# Patient Record
Sex: Male | Born: 1955 | Race: Black or African American | Hispanic: No | Marital: Single | Smoking: Never smoker
Health system: Southern US, Community
[De-identification: ages and names within clinical notes are randomized; demographics above are authoritative.]

## PROBLEM LIST (undated history)

## (undated) DIAGNOSIS — E785 Hyperlipidemia, unspecified: Secondary | ICD-10-CM

## (undated) DIAGNOSIS — G808 Other cerebral palsy: Secondary | ICD-10-CM

## (undated) DIAGNOSIS — H409 Unspecified glaucoma: Secondary | ICD-10-CM

## (undated) DIAGNOSIS — G40909 Epilepsy, unspecified, not intractable, without status epilepticus: Secondary | ICD-10-CM

## (undated) DIAGNOSIS — F79 Unspecified intellectual disabilities: Secondary | ICD-10-CM

## (undated) DIAGNOSIS — Z930 Tracheostomy status: Secondary | ICD-10-CM

---

## 2019-01-15 ENCOUNTER — Encounter (HOSPITAL_COMMUNITY): Payer: Self-pay | Admitting: Emergency Medicine

## 2019-01-15 ENCOUNTER — Emergency Department (HOSPITAL_COMMUNITY)
Admission: EM | Admit: 2019-01-15 | Discharge: 2019-01-16 | Disposition: A | Discharge status: Home / Self Care | Payer: 59 | Attending: Emergency Medicine | Admitting: Emergency Medicine

## 2019-01-15 ENCOUNTER — Emergency Department (HOSPITAL_COMMUNITY): Payer: 59

## 2019-01-15 ENCOUNTER — Other Ambulatory Visit: Payer: Self-pay

## 2019-01-15 DIAGNOSIS — Z794 Long term (current) use of insulin: Secondary | ICD-10-CM | POA: Insufficient documentation

## 2019-01-15 DIAGNOSIS — Z7901 Long term (current) use of anticoagulants: Secondary | ICD-10-CM | POA: Diagnosis not present

## 2019-01-15 DIAGNOSIS — Z20828 Contact with and (suspected) exposure to other viral communicable diseases: Secondary | ICD-10-CM | POA: Insufficient documentation

## 2019-01-15 DIAGNOSIS — R042 Hemoptysis: Secondary | ICD-10-CM | POA: Diagnosis not present

## 2019-01-15 DIAGNOSIS — Z79899 Other long term (current) drug therapy: Secondary | ICD-10-CM | POA: Insufficient documentation

## 2019-01-15 NOTE — ED Triage Notes (Addendum)
[facility]- pt is a transfer from [facility]. Pt is dependent on a trach vent. Pt was recently started on Eliquis due to afib. Today the nurses at [facility] were suctioning the patient and ended up noticing a lot of blood coming from his trach vent. Estimated about 200 mL. There physician sent the patient here for evaluation. Pt is currently stable. Pt has a foley placed and a PICC line in the right upper arm. 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 Pt is at baseline and only opens his eyes. Does not respond to commands

## 2019-01-15 NOTE — ED Notes (Signed)
ED Provider at bedside. 

## 2019-01-16 DIAGNOSIS — R042 Hemoptysis: Secondary | ICD-10-CM | POA: Diagnosis not present

## 2019-01-16 DIAGNOSIS — J9621 Acute and chronic respiratory failure with hypoxia: Secondary | ICD-10-CM

## 2019-01-16 LAB — CBC
HCT: 33.4 % — ABNORMAL LOW (ref 39.0–52.0)
Hemoglobin: 10.8 g/dL — ABNORMAL LOW (ref 13.0–17.0)
MCH: 30.9 pg (ref 26.0–34.0)
MCHC: 32.3 g/dL (ref 30.0–36.0)
MCV: 95.7 fL (ref 80.0–100.0)
Platelets: 403 10*3/uL — ABNORMAL HIGH (ref 150–400)
RBC: 3.49 MIL/uL — ABNORMAL LOW (ref 4.22–5.81)
RDW: 17.5 % — ABNORMAL HIGH (ref 11.5–15.5)
WBC: 7.6 10*3/uL (ref 4.0–10.5)
nRBC: 0 % (ref 0.0–0.2)

## 2019-01-16 LAB — CBC WITH DIFFERENTIAL/PLATELET
Abs Immature Granulocytes: 0.03 10*3/uL (ref 0.00–0.07)
Basophils Absolute: 0 10*3/uL (ref 0.0–0.1)
Basophils Relative: 1 %
Eosinophils Absolute: 0.2 10*3/uL (ref 0.0–0.5)
Eosinophils Relative: 3 %
HCT: 32.8 % — ABNORMAL LOW (ref 39.0–52.0)
Hemoglobin: 10.3 g/dL — ABNORMAL LOW (ref 13.0–17.0)
Immature Granulocytes: 0 %
Lymphocytes Relative: 35 %
Lymphs Abs: 2.6 10*3/uL (ref 0.7–4.0)
MCH: 30.2 pg (ref 26.0–34.0)
MCHC: 31.4 g/dL (ref 30.0–36.0)
MCV: 96.2 fL (ref 80.0–100.0)
Monocytes Absolute: 0.9 10*3/uL (ref 0.1–1.0)
Monocytes Relative: 11 %
Neutro Abs: 3.8 10*3/uL (ref 1.7–7.7)
Neutrophils Relative %: 50 %
Platelets: 449 10*3/uL — ABNORMAL HIGH (ref 150–400)
RBC: 3.41 MIL/uL — ABNORMAL LOW (ref 4.22–5.81)
RDW: 17.6 % — ABNORMAL HIGH (ref 11.5–15.5)
WBC: 7.5 10*3/uL (ref 4.0–10.5)
nRBC: 0 % (ref 0.0–0.2)

## 2019-01-16 LAB — COMPREHENSIVE METABOLIC PANEL
ALT: 19 U/L (ref 0–44)
AST: 33 U/L (ref 15–41)
Albumin: 2.6 g/dL — ABNORMAL LOW (ref 3.5–5.0)
Alkaline Phosphatase: 164 U/L — ABNORMAL HIGH (ref 38–126)
Anion gap: 11 (ref 5–15)
BUN: 18 mg/dL (ref 8–23)
CO2: 28 mmol/L (ref 22–32)
Calcium: 9.6 mg/dL (ref 8.9–10.3)
Chloride: 95 mmol/L — ABNORMAL LOW (ref 98–111)
Creatinine, Ser: 0.47 mg/dL — ABNORMAL LOW (ref 0.61–1.24)
GFR calc Af Amer: >60 mL/min (ref >60)
GFR calc non Af Amer: >60 mL/min (ref >60)
Glucose, Bld: 104 mg/dL — ABNORMAL HIGH (ref 70–99)
Potassium: 4.1 mmol/L (ref 3.5–5.1)
Sodium: 134 mmol/L — ABNORMAL LOW (ref 135–145)
Total Bilirubin: 0.8 mg/dL (ref 0.3–1.2)
Total Protein: 8.3 g/dL — ABNORMAL HIGH (ref 6.5–8.1)

## 2019-01-16 LAB — PROTIME-INR
INR: 1.5 — ABNORMAL HIGH (ref 0.8–1.2)
Prothrombin Time: 17.7 seconds — ABNORMAL HIGH (ref 11.4–15.2)

## 2019-01-16 LAB — APTT: aPTT: 34 seconds (ref 24–36)

## 2019-01-16 LAB — SARS CORONAVIRUS 2 BY RT PCR (HOSPITAL ORDER, PERFORMED IN ~~LOC~~ HOSPITAL LAB): SARS Coronavirus 2: NEGATIVE

## 2019-01-16 NOTE — ED Notes (Signed)
[facility] to transport patient after shift change. Per [facility] there are no trucks available.

## 2019-01-16 NOTE — ED Notes (Signed)
[facility] transporting pt. At this time

## 2019-01-16 NOTE — ED Notes (Signed)
Confirmed transport of pt to facility. Discharge instructions discussed with Day shift Buyer, retail

## 2019-01-16 NOTE — ED Notes (Signed)
[facility] called for transport at this time. 

## 2019-01-16 NOTE — ED Notes (Signed)
[facility] called for transport to [facility]. [facility] stated would be after shift change, no trucks available

## 2019-01-16 NOTE — ED Notes (Signed)
Discharge Instructions discussed with, Chartered loss adjuster, at [facility]. Pt to go back to facility.

## 2019-01-16 NOTE — ED Provider Notes (Signed)
[facility] EMERGENCY DEPARTMENT Provider Note   CSN: [id number] Arrival date & time: 01/15/19  2243     History   Chief Complaint No chief complaint on file.   HPI Roy Crawford is a 63 y.o. male.     HPI  Level 5 caveat for cognitive delay.  63 year old comes in a chief complaint of hemoptysis. Patient has history of A. fib on Eliquis, cognitive delay.  Patient had respiratory failure after a bout of aspiration pneumonia earlier this year, now is vent dependent and residing at an LTAC.  He was sent from the [facility] facility with chief complaint of large-volume hemoptysis.  According to the nursing report, patient had about 200 cc of bloody aspirate from his trach prior to ED arrival.  Spoke with the facility and they deny any fevers, chills.   History reviewed. No pertinent past medical history.  There are no active problems to display for this patient.   History reviewed. No pertinent surgical history.      Home Medications    Prior to Admission medications   Medication Sig Start Date End Date Taking? Authorizing Provider  albuterol (PROVENTIL) (2.5 MG/3ML) 0.083% nebulizer solution Take 2.5 mg by nebulization every 6 (six) hours.   Yes [provider]  apixaban (ELIQUIS) 5 MG TABS tablet Place 5 mg into feeding tube 2 (two) times daily.   Yes [provider]  chlorhexidine (PERIDEX) 0.12 % solution Use as directed 15 mLs in the mouth or throat 2 (two) times daily.   Yes [provider]  diltiazem (CARDIZEM) 30 MG tablet Place 30 mg into feeding tube 3 (three) times daily.   Yes [provider]  esomeprazole (NEXIUM) 40 MG capsule 40 mg daily at 12 noon. Per Tube   Yes [provider]  furosemide (LASIX) 20 MG tablet Place 20 mg into feeding tube daily.   Yes [provider]  ibuprofen (ADVIL) 600 MG tablet Place 600 mg into feeding tube every 6 (six) hours as needed for fever or mild pain.   Yes  [provider]  insulin lispro (HUMALOG) 100 UNIT/ML injection Inject 2-10 Units into the skin 3 (three) times daily before meals. Blood sugar > 400 Notify MD 151-200= 2 units 201-250= 4 units 251-300= 6 units 301-350= 8 units 351-400= 10 units   Yes [provider]  lactulose (CEPHULAC) 10 g packet 10 g daily as needed (for constipation). Per tube   Yes [provider]  levETIRAcetam (KEPPRA) 1000 MG tablet Place 1,000 mg into feeding tube 2 (two) times daily.   Yes [provider]  lidocaine 20 MG/ML injection 60 mg as needed (for cough). nebulization   Yes [provider]  LORazepam (ATIVAN) 2 MG/ML injection Inject 2 mg into the vein every 2 (two) hours as needed for anxiety.   Yes [provider]  magnesium oxide (MAG-OX) 400 MG tablet Place 400 mg into feeding tube daily.   Yes [provider]  metoCLOPramide (REGLAN) 5 MG tablet Place 5 mg into feeding tube 4 (four) times daily.   Yes [provider]  metoprolol tartrate (LOPRESSOR) 5 MG/5ML SOLN injection Inject 5 mg into the vein every 6 (six) hours as needed (for heart rate over 120).   Yes [provider]  metoprolol tartrate (LOPRESSOR) 50 MG tablet Place 50 mg into feeding tube 2 (two) times daily.   Yes [provider]  Nutritional Supplements (REPLETE) LIQD Place 55 mL/hr into feeding tube  continuous. 0.06 gram-1 kcal/ml   Yes [provider]  polyethylene glycol (MIRALAX / GLYCOLAX) 17 g packet Place 17 g into feeding tube daily as needed for mild constipation.   Yes [provider]  valproic acid (DEPAKENE) 250 MG/5ML SOLN solution Place 1,000 mg into feeding tube 3 (three) times daily.   Yes [provider]    Family History No family history on file.  Social History Social History   Tobacco Use  . Smoking status: Not on file  Substance Use Topics  . Alcohol use: Not on file  . Drug use: Not on file      Allergies   Patient has no allergy information on record.   Review of Systems Review of Systems  Unable to perform ROS: Mental status change     Physical Exam Updated Vital Signs BP (!) 154/76   Pulse 78   Temp 99.9 F (37.7 C) (Oral)   Resp (!) 31   SpO2 99%   Physical Exam Vitals signs and nursing note reviewed.  Constitutional:      General: He is not in acute distress.    Appearance: He is well-developed. He is not diaphoretic.  HENT:     Head: Atraumatic.  Neck:     Musculoskeletal: Neck supple.  Cardiovascular:     Rate and Rhythm: Normal rate.  Pulmonary:     Breath sounds: No wheezing or rhonchi.     Comments: On a ventilator, respiratory rate about 22. Musculoskeletal:     Comments: Contracted, no focal edema  Skin:    General: Skin is warm.  Neurological:     Mental Status: He is oriented to person, place, and time.      ED Treatments / Results  Labs (all labs ordered are listed, but only abnormal results are displayed) Labs Reviewed  COMPREHENSIVE METABOLIC PANEL - Abnormal; Notable for the following components:      Result Value   Sodium 134 (*)    Chloride 95 (*)    Glucose, Bld 104 (*)    Creatinine, Ser 0.47 (*)    Total Protein 8.3 (*)    Albumin 2.6 (*)    Alkaline Phosphatase 164 (*)    All other components within normal limits  CBC WITH DIFFERENTIAL/PLATELET - Abnormal; Notable for the following components:   RBC 3.41 (*)    Hemoglobin 10.3 (*)    HCT 32.8 (*)    RDW 17.6 (*)    Platelets 449 (*)    All other components within normal limits  PROTIME-INR - Abnormal; Notable for the following components:   Prothrombin Time 17.7 (*)    INR 1.5 (*)    All other components within normal limits  CBC - Abnormal; Notable for the following components:   RBC 3.49 (*)    Hemoglobin 10.8 (*)    HCT 33.4 (*)    RDW 17.5 (*)    Platelets 403 (*)    All other components within normal limits  SARS CORONAVIRUS 2 (HOSPITAL ORDER,  [facility])  APTT    EKG EKG Interpretation  Date/Time:  Saturday January 16 2019 01:05:25 EDT Ventricular Rate:  66 PR Interval:    QRS Duration: 84 QT Interval:  418 QTC Calculation: 438 R Axis:   79 Text Interpretation:  Sinus rhythm Short PR interval Repol abnrm suggests ischemia, diffuse leads No acute changes No old tracing to compare Confirmed by Varney Biles [phone]) on 01/16/2019 1:53:08 AM  Radiology Dg Chest Port 1 View  Result Date: 01/16/2019 CLINICAL DATA:  Hemoptysis EXAM: PORTABLE CHEST 1 VIEW COMPARISON:  None. FINDINGS: Tracheostomy is in place with the tip projecting over the mid trachea. Severe thoracolumbar scoliosis. There is cardiomegaly with vascular congestion. No confluent opacity, overt edema or effusions. No acute bony abnormality. IMPRESSION: Cardiomegaly, vascular congestion. Electronically Signed   By: Rolm Baptise M.D.   On: 01/16/2019 00:15    Procedures Procedures (including critical care time)  Medications Ordered in ED Medications - No data to display   Initial Impression / Assessment and Plan / ED Course  I have reviewed the triage vital signs and the nursing notes.  Pertinent labs & imaging results that were available during my care of the patient were reviewed by me and considered in my medical decision making (see chart for details).        63 year old male comes in a chief complaint of large-volume hemoptysis.  He is a vent dependent male who is got history of A. fib and is on DOAC. In the ED, respiratory therapy was called to suction the patient.  We had blood-tinged aspirate about 5 to 10 cc.  Subsequently, there is blood-tinged sputum that is pink in the tubing.  Chest x-ray is negative. Lung exam is not showing any focal abnormalities. White count is normal and there is no fever here.  Differential diagnosis includes pneumonia, PE. We will consult critical care given he is on blood thinners to see  if patient needs admission.  Reassessment:  Pulmonary team is assessed the patient.  He looks stable to them at this time.  They recommend prolonged monitoring in the ED and repeat CBC.  Lab work-up continues to be normal on repeat CBC.  No further episodes of large-volume hemoptysis in the ER.  Chest x-ray reviewed 1 more time and patient reassessed, there is no sign of decline or change in exam.  Stable for discharge.  We will request that the Eliquis be held for 2 or 3 days.   Final Clinical Impressions(s) / ED Diagnoses   Final diagnoses:  Hemoptysis    ED Discharge Orders    None       Varney Biles, MD 01/16/19 [phone]

## 2019-01-16 NOTE — ED Notes (Signed)
All Roy Crawford documentation, discharge instructions, and DNR given to Roy Crawford

## 2019-01-16 NOTE — Discharge Instructions (Addendum)
Hemoglobin stable here and no further episodes of hemoptysis. Blood tinged discharge was noted in suction here.  Xrays are stable.  We recommend holding any blood thinner for 2 days.

## 2019-01-16 NOTE — ED Notes (Signed)
Pulmonologist at bedside

## 2019-01-16 NOTE — H&P (Signed)
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  History of present illness   Nurses at the [facility] noticed bloody secretions on suctioning patient today, reporting 234mL, and sent him to the ED for evaluation.  Patient was evidently recently started on apixaban for Afib.  In the ED, patient is hemodynamically stable, a little hypertensive, and on his home vent settings, satting 100%.    Patient was admitted in July at [facility] for pneumonia and volume overload.  That is when patient was intubated and trached, treated for MRSA pneumonia and diuresed.  Discharged to [facility] initially, then to [facility].    Past Medical History  Epilepsy Chronic contractures Scoliosis HTN Chronic anemia Afib OSA Significant Hospital Events     Consults:  Pulm/cc in the ED  Procedures:    Significant Diagnostic Tests:    Micro Data:  COVID negative  Antimicrobials:  None   Objective   Blood pressure (!) 153/79, pulse 60, temperature 99.9 F (37.7 C), temperature source Oral, resp. rate 12, SpO2 100 %.    Vent Mode: PRVC FiO2 (%):  [30 %] 30 % Set Rate:  [12 bmp] 12 bmp Vt Set:  [500 mL] 500 mL PEEP:  [5 cmH20] 5 cmH20 Plateau Pressure:  [20 cmH20] 20 cmH20  No intake or output data in the 24 hours ending 01/16/19 0129 There were no vitals filed for this visit.  Examination: General: contractures, trach-vent, opens eyes to voice HENT: trach intact, no weeping. Minimal blood-tinged secretions in tubing Lungs: clear bilaterally, no crackles, wheezes Cardiovascular: RRR, 2+ pulses Abdomen: soft, nontender Extremities: 1+ edema, Bilateral contractures in upper and lower  extremities Neuro: at baseline.  Opens eyes to voice.  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  Currently with minimal bloody secretions, CBC is stable at baseline, and patient is a bit hypertensive with signs of vascular congestion on CXR  # Hemoptysis:  Currently with only minimal secretions which are blood-tinged.  CXR within normal limits except for some increased vascular congestion.  Etiology likely multifactorial- blood thinners + HTN + pulmonary congestion.  - recommend recheck CBC in the ED, if stable, and no more hemoptysis, will be ok to discharge back to SNF - would recommend improving blood pressure control and increasing diuresis - would hold apixaban for 2-3 days   Please do not hesitate to call with any questions or concerns  Best practice:   Labs   CBC: Recent Labs  Lab 01/16/19 0008  WBC 7.5  NEUTROABS 3.8  HGB 10.3*  HCT 32.8*  MCV 96.2  PLT 449*    Basic Metabolic Panel: Recent Labs  Lab 01/16/19 0008  NA 134*  K 4.1  CL 95*  CO2 28  GLUCOSE 104*  BUN 18  CREATININE 0.47*  CALCIUM 9.6   GFR: CrCl cannot be calculated (Unknown ideal weight.). Recent Labs  Lab 01/16/19 0008  WBC  7.5    Liver Function Tests: Recent Labs  Lab 01/16/19 0008  AST 33  ALT 19  ALKPHOS 164*  BILITOT 0.8  PROT 8.3*  ALBUMIN 2.6*   No results for input(s): LIPASE, AMYLASE in the last 168 hours. No results for input(s): AMMONIA in the last 168 hours.  ABG No results found for: PHART, PCO2ART, PO2ART, HCO3, TCO2, ACIDBASEDEF, O2SAT   Coagulation Profile: Recent Labs  Lab 01/16/19 0008  INR 1.5*    Cardiac Enzymes: No  results for input(s): CKTOTAL, CKMB, CKMBINDEX, TROPONINI in the last 168 hours.  HbA1C: No results found for: HGBA1C  CBG: No results for input(s): GLUCAP in the last 168 hours.  Review of Systems:   Unable to obtain due to nonverbal status  Past Medical History  He,  has no past medical history on file.   Surgical History   History reviewed. No pertinent surgical history.   Social History      Family History   His family history is not on file.   Allergies Not on File   Home Medications  Prior to Admission medications   Not on File     Critical care time: 40 minutes

## 2019-03-05 ENCOUNTER — Encounter (HOSPITAL_COMMUNITY): Payer: Self-pay | Admitting: Internal Medicine

## 2019-03-05 ENCOUNTER — Inpatient Hospital Stay (HOSPITAL_COMMUNITY)
Admission: EM | Admit: 2019-03-05 | Discharge: 2019-03-08 | DRG: 871 | Disposition: A | Discharge status: Skilled Nursing Facility | Payer: Medicare Other | Source: Skilled Nursing Facility | Attending: Internal Medicine | Admitting: Internal Medicine

## 2019-03-05 ENCOUNTER — Other Ambulatory Visit: Payer: Self-pay

## 2019-03-05 ENCOUNTER — Emergency Department (HOSPITAL_COMMUNITY): Payer: Medicare Other

## 2019-03-05 DIAGNOSIS — R7881 Bacteremia: Secondary | ICD-10-CM | POA: Diagnosis not present

## 2019-03-05 DIAGNOSIS — Z931 Gastrostomy status: Secondary | ICD-10-CM | POA: Diagnosis not present

## 2019-03-05 DIAGNOSIS — F79 Unspecified intellectual disabilities: Secondary | ICD-10-CM

## 2019-03-05 DIAGNOSIS — A4152 Sepsis due to Pseudomonas: Principal | ICD-10-CM | POA: Diagnosis present

## 2019-03-05 DIAGNOSIS — B965 Pseudomonas (aeruginosa) (mallei) (pseudomallei) as the cause of diseases classified elsewhere: Secondary | ICD-10-CM | POA: Diagnosis not present

## 2019-03-05 DIAGNOSIS — Z79899 Other long term (current) drug therapy: Secondary | ICD-10-CM | POA: Diagnosis not present

## 2019-03-05 DIAGNOSIS — J189 Pneumonia, unspecified organism: Secondary | ICD-10-CM | POA: Diagnosis present

## 2019-03-05 DIAGNOSIS — E119 Type 2 diabetes mellitus without complications: Secondary | ICD-10-CM | POA: Diagnosis present

## 2019-03-05 DIAGNOSIS — E785 Hyperlipidemia, unspecified: Secondary | ICD-10-CM | POA: Diagnosis present

## 2019-03-05 DIAGNOSIS — G40909 Epilepsy, unspecified, not intractable, without status epilepticus: Secondary | ICD-10-CM | POA: Diagnosis present

## 2019-03-05 DIAGNOSIS — Z930 Tracheostomy status: Secondary | ICD-10-CM | POA: Diagnosis not present

## 2019-03-05 DIAGNOSIS — E876 Hypokalemia: Secondary | ICD-10-CM | POA: Diagnosis present

## 2019-03-05 DIAGNOSIS — Y95 Nosocomial condition: Secondary | ICD-10-CM | POA: Diagnosis present

## 2019-03-05 DIAGNOSIS — Z7901 Long term (current) use of anticoagulants: Secondary | ICD-10-CM | POA: Diagnosis not present

## 2019-03-05 DIAGNOSIS — R6521 Severe sepsis with septic shock: Secondary | ICD-10-CM | POA: Diagnosis present

## 2019-03-05 DIAGNOSIS — Z20828 Contact with and (suspected) exposure to other viral communicable diseases: Secondary | ICD-10-CM | POA: Diagnosis present

## 2019-03-05 DIAGNOSIS — E875 Hyperkalemia: Secondary | ICD-10-CM | POA: Diagnosis present

## 2019-03-05 DIAGNOSIS — Z794 Long term (current) use of insulin: Secondary | ICD-10-CM

## 2019-03-05 DIAGNOSIS — I10 Essential (primary) hypertension: Secondary | ICD-10-CM | POA: Diagnosis present

## 2019-03-05 DIAGNOSIS — G808 Other cerebral palsy: Secondary | ICD-10-CM | POA: Diagnosis present

## 2019-03-05 DIAGNOSIS — J9601 Acute respiratory failure with hypoxia: Secondary | ICD-10-CM | POA: Diagnosis present

## 2019-03-05 DIAGNOSIS — Z66 Do not resuscitate: Secondary | ICD-10-CM | POA: Diagnosis present

## 2019-03-05 DIAGNOSIS — H409 Unspecified glaucoma: Secondary | ICD-10-CM | POA: Diagnosis present

## 2019-03-05 DIAGNOSIS — J9621 Acute and chronic respiratory failure with hypoxia: Secondary | ICD-10-CM | POA: Diagnosis present

## 2019-03-05 DIAGNOSIS — A419 Sepsis, unspecified organism: Secondary | ICD-10-CM | POA: Diagnosis present

## 2019-03-05 HISTORY — DX: Hyperlipidemia, unspecified: E78.5

## 2019-03-05 HISTORY — DX: Unspecified glaucoma: H40.9

## 2019-03-05 HISTORY — DX: Unspecified intellectual disabilities: F79

## 2019-03-05 HISTORY — DX: Tracheostomy status: Z93.0

## 2019-03-05 HISTORY — DX: Other cerebral palsy: G80.8

## 2019-03-05 HISTORY — DX: Epilepsy, unspecified, not intractable, without status epilepticus: G40.909

## 2019-03-05 LAB — POCT I-STAT 7, (LYTES, BLD GAS, ICA,H+H)
Acid-Base Excess: 5 mmol/L — ABNORMAL HIGH (ref 0.0–2.0)
Bicarbonate: 29.1 mmol/L — ABNORMAL HIGH (ref 20.0–28.0)
Calcium, Ion: 1.19 mmol/L (ref 1.15–1.40)
HCT: 23 % — ABNORMAL LOW (ref 39.0–52.0)
Hemoglobin: 7.8 g/dL — ABNORMAL LOW (ref 13.0–17.0)
O2 Saturation: 98 %
Patient temperature: 104
Potassium: 3.7 mmol/L (ref 3.5–5.1)
Sodium: 138 mmol/L (ref 135–145)
TCO2: 30 mmol/L (ref 22–32)
pCO2 arterial: 44.6 mmHg (ref 32.0–48.0)
pH, Arterial: 7.434 (ref 7.350–7.450)
pO2, Arterial: 114 mmHg — ABNORMAL HIGH (ref 83.0–108.0)

## 2019-03-05 LAB — SARS CORONAVIRUS 2 (TAT 6-24 HRS): SARS Coronavirus 2: NEGATIVE

## 2019-03-05 LAB — URINALYSIS, ROUTINE W REFLEX MICROSCOPIC
Bilirubin Urine: NEGATIVE
Glucose, UA: NEGATIVE mg/dL
Ketones, ur: NEGATIVE mg/dL
Nitrite: NEGATIVE
Protein, ur: 30 mg/dL — AB
Specific Gravity, Urine: 1.013 (ref 1.005–1.030)
WBC, UA: >50 WBC/hpf — ABNORMAL HIGH (ref 0–5)
pH: 5 (ref 5.0–8.0)

## 2019-03-05 LAB — PROTIME-INR
INR: 1.4 — ABNORMAL HIGH (ref 0.8–1.2)
Prothrombin Time: 16.9 seconds — ABNORMAL HIGH (ref 11.4–15.2)

## 2019-03-05 LAB — RESPIRATORY PANEL BY PCR

## 2019-03-05 LAB — CBC WITH DIFFERENTIAL/PLATELET
Abs Immature Granulocytes: 0.87 10*3/uL — ABNORMAL HIGH (ref 0.00–0.07)
Basophils Absolute: 0.1 10*3/uL (ref 0.0–0.1)
Basophils Relative: 0 %
Eosinophils Absolute: 0 10*3/uL (ref 0.0–0.5)
Eosinophils Relative: 0 %
HCT: 35.2 % — ABNORMAL LOW (ref 39.0–52.0)
Hemoglobin: 11.2 g/dL — ABNORMAL LOW (ref 13.0–17.0)
Immature Granulocytes: 4 %
Lymphocytes Relative: 10 %
Lymphs Abs: 2.1 10*3/uL (ref 0.7–4.0)
MCH: 28.5 pg (ref 26.0–34.0)
MCHC: 31.8 g/dL (ref 30.0–36.0)
MCV: 89.6 fL (ref 80.0–100.0)
Monocytes Absolute: 1.4 10*3/uL — ABNORMAL HIGH (ref 0.1–1.0)
Monocytes Relative: 7 %
Neutro Abs: 16.5 10*3/uL — ABNORMAL HIGH (ref 1.7–7.7)
Neutrophils Relative %: 79 %
Platelets: 330 10*3/uL (ref 150–400)
RBC: 3.93 MIL/uL — ABNORMAL LOW (ref 4.22–5.81)
RDW: 17 % — ABNORMAL HIGH (ref 11.5–15.5)
WBC: 20.9 10*3/uL — ABNORMAL HIGH (ref 4.0–10.5)
nRBC: 0 % (ref 0.0–0.2)

## 2019-03-05 LAB — VANCOMYCIN, RANDOM: Vancomycin Rm: 15

## 2019-03-05 LAB — COMPREHENSIVE METABOLIC PANEL
ALT: 22 U/L (ref 0–44)
AST: 43 U/L — ABNORMAL HIGH (ref 15–41)
Albumin: 2.2 g/dL — ABNORMAL LOW (ref 3.5–5.0)
Alkaline Phosphatase: 125 U/L (ref 38–126)
Anion gap: 12 (ref 5–15)
BUN: 45 mg/dL — ABNORMAL HIGH (ref 8–23)
CO2: 28 mmol/L (ref 22–32)
Calcium: 9.2 mg/dL (ref 8.9–10.3)
Chloride: 93 mmol/L — ABNORMAL LOW (ref 98–111)
Creatinine, Ser: 0.98 mg/dL (ref 0.61–1.24)
GFR calc Af Amer: >60 mL/min (ref >60)
GFR calc non Af Amer: >60 mL/min (ref >60)
Glucose, Bld: 90 mg/dL (ref 70–99)
Potassium: 4.3 mmol/L (ref 3.5–5.1)
Sodium: 133 mmol/L — ABNORMAL LOW (ref 135–145)
Total Bilirubin: 1 mg/dL (ref 0.3–1.2)
Total Protein: 6.7 g/dL (ref 6.5–8.1)

## 2019-03-05 LAB — FERRITIN: Ferritin: 318 ng/mL (ref 24–336)

## 2019-03-05 LAB — APTT: aPTT: 37 seconds — ABNORMAL HIGH (ref 24–36)

## 2019-03-05 LAB — LACTIC ACID, PLASMA
Lactic Acid, Venous: 3.3 mmol/L (ref 0.5–1.9)
Lactic Acid, Venous: 2.2 mmol/L (ref 0.5–1.9)
Lactic Acid, Venous: 1.1 mmol/L (ref 0.5–1.9)
Lactic Acid, Venous: 0.9 mmol/L (ref 0.5–1.9)

## 2019-03-05 LAB — C-REACTIVE PROTEIN: CRP: 28.6 mg/dL — ABNORMAL HIGH (ref <1.0)

## 2019-03-05 LAB — FIBRINOGEN: Fibrinogen: 729 mg/dL — ABNORMAL HIGH (ref 210–475)

## 2019-03-05 LAB — HEMOGLOBIN A1C
Hgb A1c MFr Bld: 5.3 % (ref 4.8–5.6)
Mean Plasma Glucose: 105.41 mg/dL

## 2019-03-05 LAB — BRAIN NATRIURETIC PEPTIDE: B Natriuretic Peptide: 655.1 pg/mL — ABNORMAL HIGH (ref 0.0–100.0)

## 2019-03-05 LAB — CBG MONITORING, ED: Glucose-Capillary: 93 mg/dL (ref 70–99)

## 2019-03-05 LAB — PROCALCITONIN: Procalcitonin: 18.59 ng/mL

## 2019-03-05 LAB — D-DIMER, QUANTITATIVE: D-Dimer, Quant: 4.84 ug/mL-FEU — ABNORMAL HIGH (ref 0.00–0.50)

## 2019-03-05 LAB — LACTATE DEHYDROGENASE: LDH: 158 U/L (ref 98–192)

## 2019-03-05 LAB — GLUCOSE, CAPILLARY: Glucose-Capillary: 115 mg/dL — ABNORMAL HIGH (ref 70–99)

## 2019-03-05 MED ORDER — SODIUM CHLORIDE 0.9 % IV BOLUS (SEPSIS)
500.0000 mL | Freq: Once | INTRAVENOUS | Status: AC
  Administered 2019-03-05: 500 mL via INTRAVENOUS

## 2019-03-05 MED ORDER — LACTULOSE 10 G PO PACK: 10.0000 g | PACK | Freq: Every day | ORAL | Status: DC | PRN

## 2019-03-05 MED ORDER — ACETAMINOPHEN 650 MG RE SUPP: 650.0000 mg | Freq: Four times a day (QID) | RECTAL | Status: DC | PRN

## 2019-03-05 MED ORDER — POLYETHYLENE GLYCOL 3350 17 G PO PACK: 17.0000 g | PACK | Freq: Every day | ORAL | Status: DC | PRN

## 2019-03-05 MED ORDER — VITAL HIGH PROTEIN PO LIQD
55.0000 mL/h | ORAL | Status: DC
  Administered 2019-03-05 – 2019-03-08 (×2): 55 mL/h
  Filled 2019-03-05 (×4): qty 1000

## 2019-03-05 MED ORDER — ACETAMINOPHEN 325 MG PO TABS
650.0000 mg | ORAL_TABLET | Freq: Four times a day (QID) | ORAL | Status: DC | PRN
  Administered 2019-03-07: 650 mg via ORAL
  Filled 2019-03-05: qty 2

## 2019-03-05 MED ORDER — SODIUM CHLORIDE 0.9 % IV SOLN
2.0000 g | Freq: Three times a day (TID) | INTRAVENOUS | Status: DC
  Administered 2019-03-05 – 2019-03-08 (×8): 2 g via INTRAVENOUS
  Filled 2019-03-05 (×14): qty 2

## 2019-03-05 MED ORDER — CHLORHEXIDINE GLUCONATE 0.12 % MT SOLN
15.0000 mL | Freq: Two times a day (BID) | OROMUCOSAL | Status: DC
  Administered 2019-03-05 – 2019-03-08 (×7): 15 mL via OROMUCOSAL
  Filled 2019-03-05 (×8): qty 15

## 2019-03-05 MED ORDER — ONDANSETRON HCL 4 MG PO TABS: 4.0000 mg | ORAL_TABLET | Freq: Four times a day (QID) | ORAL | Status: DC | PRN

## 2019-03-05 MED ORDER — SODIUM CHLORIDE 0.9 % IV SOLN
INTRAVENOUS | Status: DC
  Administered 2019-03-05 – 2019-03-06 (×3): via INTRAVENOUS

## 2019-03-05 MED ORDER — SODIUM CHLORIDE 0.9 % IV SOLN
2.0000 g | Freq: Once | INTRAVENOUS | Status: AC
  Administered 2019-03-05: 2 g via INTRAVENOUS
  Filled 2019-03-05: qty 2

## 2019-03-05 MED ORDER — SODIUM CHLORIDE 0.9 % IV SOLN
500.0000 mg | Freq: Once | INTRAVENOUS | Status: AC
  Administered 2019-03-05: 500 mg via INTRAVENOUS
  Filled 2019-03-05: qty 500

## 2019-03-05 MED ORDER — SODIUM CHLORIDE 0.9 % IV BOLUS (SEPSIS)
1000.0000 mL | Freq: Once | INTRAVENOUS | Status: AC
  Administered 2019-03-05: 1000 mL via INTRAVENOUS

## 2019-03-05 MED ORDER — MAGNESIUM OXIDE 400 (241.3 MG) MG PO TABS
400.0000 mg | ORAL_TABLET | Freq: Every day | ORAL | Status: DC
  Administered 2019-03-05 – 2019-03-08 (×4): 400 mg
  Filled 2019-03-05 (×4): qty 1

## 2019-03-05 MED ORDER — CHLORHEXIDINE GLUCONATE CLOTH 2 % EX PADS
6.0000 | MEDICATED_PAD | Freq: Every day | CUTANEOUS | Status: DC
  Administered 2019-03-05 – 2019-03-08 (×4): 6 via TOPICAL

## 2019-03-05 MED ORDER — ENOXAPARIN SODIUM 40 MG/0.4ML ~~LOC~~ SOLN
40.0000 mg | SUBCUTANEOUS | Status: DC
  Administered 2019-03-05 – 2019-03-08 (×4): 40 mg via SUBCUTANEOUS
  Filled 2019-03-05 (×5): qty 0.4

## 2019-03-05 MED ORDER — VALPROIC ACID 250 MG/5ML PO SOLN
1000.0000 mg | Freq: Three times a day (TID) | ORAL | Status: DC
  Administered 2019-03-05 – 2019-03-08 (×10): 1000 mg
  Filled 2019-03-05 (×13): qty 20

## 2019-03-05 MED ORDER — LEVETIRACETAM 500 MG PO TABS: 1000.0000 mg | ORAL_TABLET | Freq: Two times a day (BID) | ORAL | Status: DC

## 2019-03-05 MED ORDER — INSULIN ASPART 100 UNIT/ML ~~LOC~~ SOLN
0.0000 [IU] | Freq: Three times a day (TID) | SUBCUTANEOUS | Status: DC
  Administered 2019-03-06 (×2): 2 [IU] via SUBCUTANEOUS
  Administered 2019-03-06 – 2019-03-08 (×2): 1 [IU] via SUBCUTANEOUS

## 2019-03-05 MED ORDER — METOCLOPRAMIDE HCL 5 MG PO TABS
5.0000 mg | ORAL_TABLET | Freq: Four times a day (QID) | ORAL | Status: DC
  Administered 2019-03-05 – 2019-03-08 (×11): 5 mg
  Filled 2019-03-05 (×11): qty 1

## 2019-03-05 MED ORDER — VANCOMYCIN HCL IN DEXTROSE 1-5 GM/200ML-% IV SOLN: 1000.0000 mg | Freq: Once | INTRAVENOUS | Status: DC

## 2019-03-05 MED ORDER — ONDANSETRON HCL 4 MG/2ML IJ SOLN
4.0000 mg | Freq: Four times a day (QID) | INTRAMUSCULAR | Status: DC | PRN
  Administered 2019-03-05: 4 mg via INTRAVENOUS
  Filled 2019-03-05 (×2): qty 2

## 2019-03-05 MED ORDER — ACETAMINOPHEN 650 MG RE SUPP
650.0000 mg | Freq: Once | RECTAL | Status: AC
  Administered 2019-03-05: 650 mg via RECTAL
  Filled 2019-03-05: qty 1

## 2019-03-05 MED ORDER — SODIUM CHLORIDE 0.9% FLUSH
3.0000 mL | Freq: Two times a day (BID) | INTRAVENOUS | Status: DC
  Administered 2019-03-05 – 2019-03-08 (×6): 3 mL via INTRAVENOUS

## 2019-03-05 MED ORDER — REPLETE PO LIQD: 55.0000 mL/h | ORAL | Status: DC

## 2019-03-05 MED ORDER — METHYLPREDNISOLONE SODIUM SUCC 125 MG IJ SOLR
0.5000 mg/kg | Freq: Two times a day (BID) | INTRAMUSCULAR | Status: DC
  Administered 2019-03-05 – 2019-03-08 (×6): 41.875 mg via INTRAVENOUS
  Filled 2019-03-05 (×6): qty 2

## 2019-03-05 MED ORDER — LACTULOSE 10 GM/15ML PO SOLN
10.0000 g | Freq: Every day | ORAL | Status: DC | PRN
  Filled 2019-03-05: qty 15

## 2019-03-05 MED ORDER — PANTOPRAZOLE SODIUM 40 MG PO TBEC: 40.0000 mg | DELAYED_RELEASE_TABLET | Freq: Every day | ORAL | Status: DC

## 2019-03-05 MED ORDER — LORAZEPAM 2 MG/ML IJ SOLN
2.0000 mg | INTRAMUSCULAR | Status: DC | PRN
  Administered 2019-03-05 – 2019-03-06 (×2): 2 mg via INTRAVENOUS
  Filled 2019-03-05 (×3): qty 1

## 2019-03-05 MED ORDER — PANTOPRAZOLE SODIUM 40 MG PO PACK
40.0000 mg | PACK | Freq: Every day | ORAL | Status: DC
  Administered 2019-03-05 – 2019-03-08 (×4): 40 mg
  Filled 2019-03-05 (×5): qty 20

## 2019-03-05 MED ORDER — LEVETIRACETAM 100 MG/ML PO SOLN
1000.0000 mg | Freq: Two times a day (BID) | ORAL | Status: DC
  Administered 2019-03-05 – 2019-03-08 (×7): 1000 mg
  Filled 2019-03-05 (×9): qty 10

## 2019-03-05 MED ORDER — VANCOMYCIN HCL 10 G IV SOLR
1250.0000 mg | INTRAVENOUS | Status: DC
  Administered 2019-03-05: 1250 mg via INTRAVENOUS
  Filled 2019-03-05 (×2): qty 1250

## 2019-03-05 NOTE — ED Notes (Signed)
Dr Lorin Mercy aware of patient current VS orders for 500 cc bolus NS , given

## 2019-03-05 NOTE — ED Notes (Signed)
Only able to obtain 1 set of cultures MD aware

## 2019-03-05 NOTE — ED Notes (Signed)
Respiratory requested for arterial blood gas. RT to collect as soon as possible

## 2019-03-05 NOTE — ED Triage Notes (Signed)
Pt BIB [facility] from [facility], pt trached, staff reports increased secretions, and diagnosis of pneumonia x 2 days. Staff reports fever that isn't controlled by tylenol, started on vanc and rocephin IV.

## 2019-03-05 NOTE — ED Notes (Signed)
Ordered a hospital bed per RN Kelly--Dshaun Reppucci  

## 2019-03-05 NOTE — ED Notes (Signed)
ED TO INPATIENT HANDOFF REPORT  ED Nurse Name and Phone #: [phone]  S Name/Age/Gender Roy Crawford 63 y.o. male Room/Bed: 030C/030C  Code Status   Code Status: Not on file  Home/SNF/Other Skilled nursing facility Patient oriented to: non -verbal  Is this baseline? More lethargic than normal  Triage Complete: Triage complete  Chief Complaint septic  Triage Note Pt BIB [facility] from [facility], pt trached, staff reports increased secretions, and diagnosis of pneumonia x 2 days. Staff reports fever that isn't controlled by tylenol, started on vanc and rocephin IV.    Allergies Not on File  Level of Care/Admitting Diagnosis ED Disposition    ED Disposition Condition [facility] Area: [employer] [[id number]]  Level of Care: Progressive [102]  Covid Evaluation: Person Under Investigation (PUI)  Diagnosis: Acute respiratory failure with [id number]  Admitting Physician: Shela Leff [phone]  Attending Physician: Shela Leff [phone]  Estimated length of stay: past midnight tomorrow  Certification:: I certify this patient will need inpatient services for at least 2 midnights  PT Class (Do Not Modify): Inpatient [101]  PT Acc Code (Do Not Modify): Private [1]       B Medical/Surgery History No past medical history on file. No past surgical history on file.   A IV Location/Drains/Wounds Patient Lines/Drains/Airways Status   Active Line/Drains/Airways    Name:   Placement date:   Placement time:   Site:   Days:   PICC Triple Lumen 03/05/19 PICC Right 1 cm   03/05/19    0436     less than 1   Tracheostomy Portex 8 mm Cuffed   -    -    8 mm             Intake/Output Last 24 hours No intake or output data in the 24 hours ending 03/05/19 0631  Labs/Imaging Results for orders placed or performed during the hospital encounter of 03/05/19 (from the past 48 hour(s))  Comprehensive metabolic panel      Status: Abnormal   Collection Time: 03/05/19  3:29 AM  Result Value Ref Range   Sodium 133 (L) 135 - 145 mmol/L   Potassium 4.3 3.5 - 5.1 mmol/L   Chloride 93 (L) 98 - 111 mmol/L   CO2 28 22 - 32 mmol/L   Glucose, Bld 90 70 - 99 mg/dL   BUN 45 (H) 8 - 23 mg/dL   Creatinine, Ser 0.98 0.61 - 1.24 mg/dL   Calcium 9.2 8.9 - 10.3 mg/dL   Total Protein 6.7 6.5 - 8.1 g/dL   Albumin 2.2 (L) 3.5 - 5.0 g/dL   AST 43 (H) 15 - 41 U/L   ALT 22 0 - 44 U/L   Alkaline Phosphatase 125 38 - 126 U/L   Total Bilirubin 1.0 0.3 - 1.2 mg/dL   GFR calc non Af Amer >60 >60 mL/min   GFR calc Af Amer >60 >60 mL/min   Anion gap 12 5 - 15    Comment: Performed at [facility], [address] [place], [place] [address]  CBC WITH DIFFERENTIAL     Status: Abnormal   Collection Time: 03/05/19  3:29 AM  Result Value Ref Range   WBC 20.9 (H) 4.0 - 10.5 K/uL   RBC 3.93 (L) 4.22 - 5.81 MIL/uL   Hemoglobin 11.2 (L) 13.0 - 17.0 g/dL   HCT 35.2 (L) 39.0 - 52.0 %   MCV 89.6 80.0 - 100.0  fL   MCH 28.5 26.0 - 34.0 pg   MCHC 31.8 30.0 - 36.0 g/dL   RDW 17.0 (H) 11.5 - 15.5 %   Platelets 330 150 - 400 K/uL   nRBC 0.0 0.0 - 0.2 %   Neutrophils Relative % 79 %   Neutro Abs 16.5 (H) 1.7 - 7.7 K/uL   Lymphocytes Relative 10 %   Lymphs Abs 2.1 0.7 - 4.0 K/uL   Monocytes Relative 7 %   Monocytes Absolute 1.4 (H) 0.1 - 1.0 K/uL   Eosinophils Relative 0 %   Eosinophils Absolute 0.0 0.0 - 0.5 K/uL   Basophils Relative 0 %   Basophils Absolute 0.1 0.0 - 0.1 K/uL   Immature Granulocytes 4 %   Abs Immature Granulocytes 0.87 (H) 0.00 - 0.07 K/uL    Comment: Performed at [facility] [address] [place], [place] [address]  APTT     Status: Abnormal   Collection Time: 03/05/19  3:29 AM  Result Value Ref Range   aPTT 37 (H) 24 - 36 seconds    Comment:        IF BASELINE aPTT IS ELEVATED, SUGGEST PATIENT RISK ASSESSMENT BE USED TO DETERMINE APPROPRIATE ANTICOAGULANT THERAPY. Performed at [facility], [place] [address], [place], [place] [address]   Protime-INR     Status: Abnormal   Collection Time: 03/05/19  3:29 AM  Result Value Ref Range   Prothrombin Time 16.9 (H) 11.4 - 15.2 seconds   INR 1.4 (H) 0.8 - 1.2    Comment: (NOTE) INR goal varies based on device and disease states. Performed at [facility], [place] [address], [place], [place] [address]   Lactic acid, plasma     Status: Abnormal   Collection Time: 03/05/19  3:31 AM  Result Value Ref Range   Lactic Acid, Venous 3.3 (HH) 0.5 - 1.9 mmol/L    Comment: CRITICAL RESULT CALLED TO, READ BACK BY AND VERIFIED WITH: Malachi Kinzler K,RN 03/05/19 [place] Performed at [facility] [address] [place], [place] [address]   Vancomycin, random     Status: None   Collection Time: 03/05/19  3:31 AM  Result Value Ref Range   Vancomycin Rm 15     Comment:        Random Vancomycin therapeutic range is dependent on dosage and time of specimen collection. A peak range is 20.0-40.0 ug/mL A trough range is 5.0-15.0 ug/mL        Performed at [facility] [address] [place], [place] [address]   I-STAT 7, (LYTES, BLD GAS, ICA, H+H)     Status: Abnormal   Collection Time: 03/05/19  6:22 AM  Result Value Ref Range   pH, Arterial 7.434 7.350 - 7.450   pCO2 arterial 44.6 32.0 - 48.0 mmHg   pO2, Arterial 114.0 (H) 83.0 - 108.0 mmHg   Bicarbonate 29.1 (H) 20.0 - 28.0 mmol/L   TCO2 30 22 - 32 mmol/L   O2 Saturation 98.0 %   Acid-Base Excess 5.0 (H) 0.0 - 2.0 mmol/L   Sodium 138 135 - 145 mmol/L   Potassium 3.7 3.5 - 5.1 mmol/L   Calcium, Ion 1.19 1.15 - 1.40 mmol/L   HCT 23.0 (L) 39.0 - 52.0 %   Hemoglobin 7.8 (L) 13.0 - 17.0 g/dL   Patient temperature 104.0 F    Collection site RADIAL, ALLEN'S TEST ACCEPTABLE    Drawn by RT    Sample  type ARTERIAL    Dg Chest Port 1 View  Result Date: 03/05/2019 CLINICAL DATA:  Shortness of breath EXAM: PORTABLE CHEST 1 VIEW COMPARISON:  January 15, 2019 FINDINGS: There  is mild cardiomegaly. Tracheostomy tube seen at the level of clavicular heads. There is mildly increased interstitial markings throughout both lungs. Streaky opacity seen at the right lung base and left upper lung. IMPRESSION: Nonspecific increased interstitial markings and patchy airspace opacity at the right lung base. This could be due to early infectious etiology Electronically Signed   By: Prudencio Pair M.D.   On: 03/05/2019 03:30    Pending Labs Unresulted Labs (From admission, onward)    Start     Ordered   03/05/19 0401  SARS CORONAVIRUS 2 (TAT 6-24 HRS) Nasopharyngeal Nasopharyngeal Swab  (Asymptomatic/Tier 2 Patients Labs)  Once,   STAT    Question Answer Comment  Is this test for diagnosis or screening Screening   Symptomatic for COVID-19 as defined by CDC No   Hospitalized for COVID-19 No   Admitted to ICU for COVID-19 No   Previously tested for COVID-19 Yes   Resident in a congregate (group) care setting No   Employed in healthcare setting No      03/05/19 0400   03/05/19 0305  Lactic acid, plasma  Now then every 2 hours,   STAT     03/05/19 0305   03/05/19 0305  Blood Culture (routine x 2)  BLOOD CULTURE X 2,   STAT     03/05/19 0305   03/05/19 0305  Urinalysis, Routine w reflex microscopic  ONCE - STAT,   STAT     03/05/19 0305   03/05/19 0305  Urine culture  ONCE - STAT,   STAT     03/05/19 0305          Vitals/Pain Today's Vitals   03/05/19 0303 03/05/19 0320 03/05/19 0343 03/05/19 0517  BP:    (!) 107/54  Pulse:  (!) 126 (!) 124 (!) 126  Resp:  (!) 32 (!) 23 (!) 32  Temp: (!) 104 F (40 C)     TempSrc: Rectal     SpO2:  98% 97% 96%    Isolation Precautions No active isolations  Medications Medications  sodium chloride 0.9 % bolus 1,000 mL (0 mLs Intravenous Stopped 03/05/19 0620)    And  sodium chloride 0.9 % bolus 1,000 mL (0 mLs Intravenous Stopped 03/05/19 0619)    And  sodium chloride 0.9 % bolus 500 mL (500 mLs Intravenous New Bag/Given  03/05/19 0619)  azithromycin (ZITHROMAX) 500 mg in sodium chloride 0.9 % 250 mL IVPB (500 mg Intravenous New Bag/Given 03/05/19 0612)  vancomycin (VANCOCIN) 1,250 mg in sodium chloride 0.9 % 250 mL IVPB (has no administration in time range)  ceFEPIme (MAXIPIME) 2 g in sodium chloride 0.9 % 100 mL IVPB (has no administration in time range)  ceFEPIme (MAXIPIME) 2 g in sodium chloride 0.9 % 100 mL IVPB (0 g Intravenous Stopped 03/05/19 0608)  acetaminophen (TYLENOL) suppository 650 mg (650 mg Rectal Given 03/05/19 0522)    Mobility non-ambulatory     Focused Assessments    R Recommendations: See Admitting Provider Note  Report given to:   Additional Notes:

## 2019-03-05 NOTE — Progress Notes (Signed)
ABG completed per MD per RN Claiborne Billings.

## 2019-03-05 NOTE — ED Provider Notes (Signed)
[facility] [facility] EMERGENCY DEPARTMENT Provider Note   CSN: [id number] Arrival date & time: 03/05/19  0255     History   Chief Complaint Chief Complaint  Patient presents with  . Respiratory Distress   LEVEL 5 CAVEAT DUE TO ACUITY OF CONDITION  HPI Roy Crawford is a 63 y.o. male.     The history is provided by the EMS personnel and medical records.  Shortness of Breath Severity:  Severe Onset quality:  Sudden Timing:  Constant Progression:  Worsening Chronicity:  New Relieved by:  Nothing Worsened by:  Nothing Patient with history of cerebral palsy, chronic respiratory failure with trach presents with respiratory distress.  Patient was recently diagnosed with pneumonia has been getting antibiotics.  It is reported the patient is having increasing secretions and altered mental status.  Patient is also having hypoxia and fever.    PMH - CP, respiratory failure Soc hx - lives in [place] Medications    Prior to Admission medications   Medication Sig Start Date End Date Taking? Authorizing Provider  albuterol (PROVENTIL) (2.5 MG/3ML) 0.083% nebulizer solution Take 2.5 mg by nebulization every 6 (six) hours.    [provider]  apixaban (ELIQUIS) 5 MG TABS tablet Place 5 mg into feeding tube 2 (two) times daily.    [provider]  chlorhexidine (PERIDEX) 0.12 % solution Use as directed 15 mLs in the mouth or throat 2 (two) times daily.    [provider]  diltiazem (CARDIZEM) 30 MG tablet Place 30 mg into feeding tube 3 (three) times daily.    [provider]  esomeprazole (NEXIUM) 40 MG capsule 40 mg daily at 12 noon. Per Tube    [provider]  furosemide (LASIX) 20 MG tablet Place 20 mg into feeding tube daily.    [provider]  ibuprofen (ADVIL) 600 MG tablet Place 600 mg into feeding tube every 6 (six) hours as needed for fever or mild pain.    [provider]  insulin lispro  (HUMALOG) 100 UNIT/ML injection Inject 2-10 Units into the skin 3 (three) times daily before meals. Blood sugar > 400 Notify MD 151-200= 2 units 201-250= 4 units 251-300= 6 units 301-350= 8 units 351-400= 10 units    [provider]  lactulose (CEPHULAC) 10 g packet 10 g daily as needed (for constipation). Per tube    [provider]  levETIRAcetam (KEPPRA) 1000 MG tablet Place 1,000 mg into feeding tube 2 (two) times daily.    [provider]  lidocaine 20 MG/ML injection 60 mg as needed (for cough). nebulization    [provider]  LORazepam (ATIVAN) 2 MG/ML injection Inject 2 mg into the vein every 2 (two) hours as needed for anxiety.    [provider]  magnesium oxide (MAG-OX) 400 MG tablet Place 400 mg into feeding tube daily.    [provider]  metoCLOPramide (REGLAN) 5 MG tablet Place 5 mg into feeding tube 4 (four) times daily.    [provider]  metoprolol tartrate (LOPRESSOR) 5 MG/5ML SOLN injection Inject 5 mg into the vein every 6 (six) hours as needed (for heart rate over 120).    [provider]  metoprolol tartrate (LOPRESSOR) 50 MG tablet Place 50 mg into feeding tube 2 (two) times daily.    [provider]  Nutritional Supplements (REPLETE) LIQD Place 55 mL/hr into feeding tube continuous. 0.06 gram-1 kcal/ml    [provider]  polyethylene glycol (MIRALAX /  GLYCOLAX) 17 g packet Place 17 g into feeding tube daily as needed for mild constipation.    [provider]  valproic acid (DEPAKENE) 250 MG/5ML SOLN solution Place 1,000 mg into feeding tube 3 (three) times daily.    [provider]    Family History No family history on file.  Social History Social History   Tobacco Use  . Smoking status: Not on file  Substance Use Topics  . Alcohol use: Not on file  . Drug use: Not on file     Allergies   Patient has no allergy information on record.   Review of  Systems Review of Systems  Unable to perform ROS: Acuity of condition  Respiratory: Positive for shortness of breath.      Physical Exam Updated Vital Signs Pulse (!) 124   Temp (!) 104 F (40 C) (Rectal)   Resp (!) 23   SpO2 97%   Physical Exam CONSTITUTIONAL: Ill-appearing, respiratory distress noted HEAD: Normocephalic/atraumatic EYES: EOMI ENMT: Mucous membranes moist NECK: Trach in place SPINE/BACK:entire spine nontender CV: Tachycardic LUNGS: Tachypneic, very loud coarse breath sounds bilaterally ABDOMEN: soft, nontender, PEG tube in place GU: Foley catheter in place on arrival, scrotal edema without erythema or crepitus NEURO: Pt is awake/alert EXTREMITIES: pulses normal/equal, chronic contractures noted PICC line in right upper extremity SKIN: No wounds noted to the buttocks PSYCH: Unable to assess  ED Treatments / Results  Labs (all labs ordered are listed, but only abnormal results are displayed) Labs Reviewed  LACTIC ACID, PLASMA - Abnormal; Notable for the following components:      Result Value   Lactic Acid, Venous 3.3 (*)    All other components within normal limits  COMPREHENSIVE METABOLIC PANEL - Abnormal; Notable for the following components:   Sodium 133 (*)    Chloride 93 (*)    BUN 45 (*)    Albumin 2.2 (*)    AST 43 (*)    All other components within normal limits  CBC WITH DIFFERENTIAL/PLATELET - Abnormal; Notable for the following components:   WBC 20.9 (*)    RBC 3.93 (*)    Hemoglobin 11.2 (*)    HCT 35.2 (*)    RDW 17.0 (*)    Neutro Abs 16.5 (*)    Monocytes Absolute 1.4 (*)    Abs Immature Granulocytes 0.87 (*)    All other components within normal limits  APTT - Abnormal; Notable for the following components:   aPTT 37 (*)    All other components within normal limits  PROTIME-INR - Abnormal; Notable for the following components:   Prothrombin Time 16.9 (*)    INR 1.4 (*)    All other components within normal limits  POCT  I-STAT 7, (LYTES, BLD GAS, ICA,H+H) - Abnormal; Notable for the following components:   pO2, Arterial 114.0 (*)    Bicarbonate 29.1 (*)    Acid-Base Excess 5.0 (*)    HCT 23.0 (*)    Hemoglobin 7.8 (*)    All other components within normal limits  CULTURE, BLOOD (ROUTINE X 2)  CULTURE, BLOOD (ROUTINE X 2)  URINE CULTURE  SARS CORONAVIRUS 2 (TAT 6-24 HRS)  VANCOMYCIN, RANDOM  LACTIC ACID, PLASMA  URINALYSIS, ROUTINE W REFLEX MICROSCOPIC    EKG EKG Interpretation  Date/Time:  Friday March 05 2019 03:32:19 EDT Ventricular Rate:  125 PR Interval:    QRS Duration: 122 QT Interval:  325 QTC Calculation: 469 R Axis:   31 Text Interpretation:  Sinus tachycardia Ventricular premature complex Aberrant conduction of SV complex(es) Nonspecific intraventricular conduction delay Nonspecific repol abnormality, diffuse leads Interpretation limited secondary to artifact Confirmed by Ripley Fraise [id number]) on 03/05/2019 3:37:43 AM   Radiology Dg Chest Port 1 View  Result Date: 03/05/2019 CLINICAL DATA:  Shortness of breath EXAM: PORTABLE CHEST 1 VIEW COMPARISON:  January 15, 2019 FINDINGS: There is mild cardiomegaly. Tracheostomy tube seen at the level of clavicular heads. There is mildly increased interstitial markings throughout both lungs. Streaky opacity seen at the right lung base and left upper lung. IMPRESSION: Nonspecific increased interstitial markings and patchy airspace opacity at the right lung base. This could be due to early infectious etiology Electronically Signed   By: Prudencio Pair M.D.   On: 03/05/2019 03:30    Procedures .Critical Care Performed by: Ripley Fraise, MD Authorized by: Ripley Fraise, MD   Critical care provider statement:    Critical care time (minutes):  60   Critical care start time:  03/05/2019 4:00 AM   Critical care end time:  03/05/2019 5:00 AM   Critical care time was exclusive of:  Separately billable procedures and treating other patients    Critical care was necessary to treat or prevent imminent or life-threatening deterioration of the following conditions:  Respiratory failure and sepsis   Critical care was time spent personally by me on the following activities:  Evaluation of patient's response to treatment, examination of patient, re-evaluation of patient's condition, pulse oximetry, ordering and review of radiographic studies, ordering and review of laboratory studies, ordering and performing treatments and interventions, review of old charts and discussions with consultants     Medications Ordered in ED Medications  azithromycin (ZITHROMAX) 500 mg in sodium chloride 0.9 % 250 mL IVPB (500 mg Intravenous New Bag/Given 03/05/19 0612)  vancomycin (VANCOCIN) 1,250 mg in sodium chloride 0.9 % 250 mL IVPB (has no administration in time range)  ceFEPIme (MAXIPIME) 2 g in sodium chloride 0.9 % 100 mL IVPB (has no administration in time range)  sodium chloride 0.9 % bolus 1,000 mL (0 mLs Intravenous Stopped 03/05/19 0620)    And  sodium chloride 0.9 % bolus 1,000 mL (0 mLs Intravenous Stopped 03/05/19 0619)    And  sodium chloride 0.9 % bolus 500 mL (500 mLs Intravenous New Bag/Given 03/05/19 0619)  ceFEPIme (MAXIPIME) 2 g in sodium chloride 0.9 % 100 mL IVPB (0 g Intravenous Stopped 03/05/19 0608)  acetaminophen (TYLENOL) suppository 650 mg (650 mg Rectal Given 03/05/19 0522)     Initial Impression / Assessment and Plan / ED Course  I have reviewed the triage vital signs and the nursing notes.  Pertinent labs & imaging results that were available during my care of the patient were reviewed by me and considered in my medical decision making (see chart for details).        3:58 AM Patient presents from [facility]-term acute care facility with respiratory distress.  Patient is trach dependent Patient is febrile, tachycardic and was hypoxic on arrival.  He required deep suctioning with respiratory therapy.  His trach  collar was repositioned and his oxygenation improved. Code sepsis has been called. 6:35 AM Patient with elevated lactate, but vitals are improving.  Blood pressure is stable. Patient is resting comfortably but will respond to voice. He will be admitted for worsening pneumonia. Discussed with Dr. Marlowe Sax for admission. ABG was reviewed and is improved. Continue code sepsis protocol.  Roy Crawford was evaluated in Emergency Department on 03/05/2019 for  the symptoms described in the history of present illness. He was evaluated in the context of the global COVID-19 pandemic, which necessitated consideration that the patient might be at risk for infection with the SARS-CoV-2 virus that causes COVID-19. Institutional protocols and algorithms that pertain to the evaluation of patients at risk for COVID-19 are in a state of rapid change based on information released by regulatory bodies including the CDC and federal and state organizations. These policies and algorithms were followed during the patient's care in the ED.  Final Clinical Impressions(s) / ED Diagnoses   Final diagnoses:  Acute respiratory failure with hypoxia ([place])  HCAP (healthcare-associated pneumonia)    ED Discharge Orders    None       Ripley Fraise, MD 03/05/19 [phone]

## 2019-03-05 NOTE — H&P (Signed)
History and Physical    Roy Crawford [id number] DOB: Jul 29, 1955 DOA: 03/05/2019  PCP: [name], MD Consultants:  [facility] Patient coming from: [facility]; NOK: Merrik Hayman, nephew, [phone]  Chief Complaint: Refractory PNA  HPI: Roy Crawford is a 63 y.o. male with medical history significant of cerebral palsy with intellectual disability; epilepsy; chronic contractures; glaucoma; HLD; and trach dependence presenting with refractory PNA.  He lives in the [place].  He was sent over because his BP was low (80/40).  They called his nephew about 0145 to notify him of transfer.  History otherwise as per Dr. Marlowe Sax (below), as the patient is unable to provide history.    ED Course:  Carryover, per Dr. Marlowe Sax:  Medical history significant of intellectual disability, epilepsy, chronic contractures, trach dependent presenting to the ED from [facility]. Staff reported increased secretions and diagnosis of pneumonia 2 days ago treated with vancomycin and Rocephin. Patient continued to have fevers, hypoxia, and increased secretions and sent to the ED for further evaluation. Currently requiring 10 L supplemental oxygen via trach. Febrile, tachycardic, and tachypneic. X-ray showing right lower lobe pneumonia. Lactic acid 3.3. White blood cell count above 20. Received vancomycin and cefepime in the ED. IV fluid boluses per sepsis protocol. Requested ED provider to obtain STAT ABG. Stepdown bed request placed.  Update: ABG reassuring   Review of Systems: Unable to perform   Ambulatory Status:  Non-ambulatory  Past Medical History:  Diagnosis Date  . 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 Dyslipidemia   . Epilepsy ([place])   . Glaucoma   . Intellectual disability   . Tracheostomy dependent ([place])     History reviewed. No pertinent surgical history.  Social History   Socioeconomic History  . Marital status: Single    Spouse name: Not on file   . Number of children: Not on file  . Years of education: Not on file  . Highest education level: Not on file  Occupational History  . Occupation: disabled  Social Needs  . Financial resource strain: Not on file  . Food insecurity    Worry: Not on file    Inability: Not on file  . Transportation needs    Medical: Not on file    Non-medical: Not on file  Tobacco Use  . Smoking status: Never Smoker  . Smokeless tobacco: Never Used  Substance and Sexual Activity  . Alcohol use: Never    Frequency: Never  . Drug use: Never  . Sexual activity: Not on file  Lifestyle  . Physical activity    Days per week: Not on file    Minutes per session: Not on file  . Stress: Not on file  Relationships  . Social Herbalist on phone: Not on file    Gets together: Not on file    Attends religious service: Not on file    Active member of club or organization: Not on file    Attends meetings of clubs or organizations: Not on file    Relationship status: Not on file  . Intimate partner violence    Fear of current or ex partner: Not on file    Emotionally abused: Not on file    Physically abused: Not on file    Forced sexual activity: Not on file  Other Topics Concern  . Not on file  Social History Narrative  . Not on file    Not on File  History reviewed. No pertinent  family history.  Prior to Admission medications   Medication Sig Start Date End Date Taking? Authorizing Provider  albuterol (PROVENTIL) (2.5 MG/3ML) 0.083% nebulizer solution Take 2.5 mg by nebulization every 6 (six) hours.    [provider]  apixaban (ELIQUIS) 5 MG TABS tablet Place 5 mg into feeding tube 2 (two) times daily.    [provider]  chlorhexidine (PERIDEX) 0.12 % solution Use as directed 15 mLs in the mouth or throat 2 (two) times daily.    [provider]  diltiazem (CARDIZEM) 30 MG tablet Place 30 mg into feeding tube 3 (three) times daily.    [provider]   esomeprazole (NEXIUM) 40 MG capsule 40 mg daily at 12 noon. Per Tube    [provider]  furosemide (LASIX) 20 MG tablet Place 20 mg into feeding tube daily.    [provider]  ibuprofen (ADVIL) 600 MG tablet Place 600 mg into feeding tube every 6 (six) hours as needed for fever or mild pain.    [provider]  insulin lispro (HUMALOG) 100 UNIT/ML injection Inject 2-10 Units into the skin 3 (three) times daily before meals. Blood sugar > 400 Notify MD 151-200= 2 units 201-250= 4 units 251-300= 6 units 301-350= 8 units 351-400= 10 units    [provider]  lactulose (CEPHULAC) 10 g packet 10 g daily as needed (for constipation). Per tube    [provider]  levETIRAcetam (KEPPRA) 1000 MG tablet Place 1,000 mg into feeding tube 2 (two) times daily.    [provider]  lidocaine 20 MG/ML injection 60 mg as needed (for cough). nebulization    [provider]  LORazepam (ATIVAN) 2 MG/ML injection Inject 2 mg into the vein every 2 (two) hours as needed for anxiety.    [provider]  magnesium oxide (MAG-OX) 400 MG tablet Place 400 mg into feeding tube daily.    [provider]  metoCLOPramide (REGLAN) 5 MG tablet Place 5 mg into feeding tube 4 (four) times daily.    [provider]  metoprolol tartrate (LOPRESSOR) 5 MG/5ML SOLN injection Inject 5 mg into the vein every 6 (six) hours as needed (for heart rate over 120).    [provider]  metoprolol tartrate (LOPRESSOR) 50 MG tablet Place 50 mg into feeding tube 2 (two) times daily.    [provider]  Nutritional Supplements (REPLETE) LIQD Place 55 mL/hr into feeding tube continuous. 0.06 gram-1 kcal/ml    [provider]  polyethylene glycol (MIRALAX / GLYCOLAX) 17 g packet Place 17 g into feeding tube daily as needed for mild constipation.    [provider]  valproic acid (DEPAKENE) 250 MG/5ML SOLN solution Place 1,000  mg into feeding tube 3 (three) times daily.    [provider]    Physical Exam: Vitals:   03/05/19 1100 03/05/19 1115 03/05/19 1145 03/05/19 1245  BP: (!) 104/54 (!) 106/52 100/82 (!) 109/56  Pulse: 100 (!) 101 100 (!) 102  Resp:      Temp:      TempSrc:      SpO2: 98% 100% 98% 100%     . General:  Opens eyes to voice, trach collar in place, no attempt at communication . Eyes:  PERRL, EOMI, normal lids, iris . ENT:  normal lips & tongue, trach collar in place . Cardiovascular:  RR with mild tachycardia, no m/r/g. No LE edema.  Marland Kitchen Respiratory:   Diffuse rhonchi.  Mildly increased  respiratory effort. . Abdomen:  soft, NT, ND, NABS; G-tube in place . Skin:  no rash or induration seen on limited exam; marked scrotal edema, per nursing . Musculoskeletal:  Diffuse contractures . Psychiatric:  Opens eyes to voice, did not attempt communication . Neurologic:  unable to perform    Radiological Exams on Admission: Dg Chest Port 1 View  Result Date: 03/05/2019 CLINICAL DATA:  Shortness of breath EXAM: PORTABLE CHEST 1 VIEW COMPARISON:  January 15, 2019 FINDINGS: There is mild cardiomegaly. Tracheostomy tube seen at the level of clavicular heads. There is mildly increased interstitial markings throughout both lungs. Streaky opacity seen at the right lung base and left upper lung. IMPRESSION: Nonspecific increased interstitial markings and patchy airspace opacity at the right lung base. This could be due to early infectious etiology Electronically Signed   By: Prudencio Pair M.D.   On: 03/05/2019 03:30    EKG: Independently reviewed.  Sinus tachycardia with rate 125; nonspecific ST changes    Labs on Admission: I have personally reviewed the available labs and imaging studies at the time of the admission.  Pertinent labs:   Na++ 133 BUN 45/Creatinine 0.98/GFR >60 Albumin 2.2 AST 43/ALT 22 WBC 20.9 Hgb 11.2 INR 1.4 ABG: 7.434/44.6/114.0/29.1 Lactate 3.3, 2.2 UA: moderate  Hgb, large LE, 30 protein, rare bacteria, >50 WBC Urine culture pending COVID negative   Assessment/Plan Principal Problem:   Acute respiratory failure with hypoxia (HCC) Active Problems:   Tracheostomy dependent (HCC)   Intellectual disability   Glaucoma   Epilepsy ([place])   Dyslipidemia   Cerebral palsy, quadriplegic (HCC)   RLL pneumonia   Sepsis due to pneumonia ([place])   Acute respiratory failure with hypoxia resulting from sepsis with PNA in trach-dependent patient -SIRS criteria in this patient includes: Leukocytosis, fever, tachycardia, tachypnea, hypoxia (not documented here but on 10L trach-collar O2, which appears to be new for him) -Patient has evidence of acute organ failure with elevated lactate and borderline hypotension -While awaiting blood cultures, this appears to be a preseptic condition. -Sepsis protocol initiated -Suspected source is pneumonia -Given fever to 104, apparently new O2 requirement, and infiltrate in right lower lobe on chest x-ray , most likely HCAP.  - Other etiologies include aspiration (receives tube feeds, so this is still a possibility) versus URI (most likely COVID/flu). -COVID negative, but will repeat given the high level of concern for infection in this patient and will leave as PUI for now. -CURB-65 score is 5, meaning that the patient has a 40% risk of death -Pneumonia Severity Index (PSI) is Class 5, 27% mortality. -Corticosteroids have been to shown to low overall mortality rate; risk of ARDS; and need for mechanical ventilation.  This is particularly true in severe PNA (class 3+ PSI).  Will add 0.5 mg/kg Solumedrol for now. -The patient has the following criteria for MDR (multi-drug resistance): septic shock; SNF placement. -The patient will need treatment for HCAP due to MDR risk factors as above; will treat with Cefepime and Vancomycin. -NS @ 100 cc/hr -Fever control -Repeat CBC in am -Sputum cultures -Respiratory virus panel ordered  to include pertussis and flu -Will order lower respiratory tract procalcitonin level.  Antibiotics would not be indicated for PCT <0.1 and probably should not be used for < 0.25.  >0.5 indicates infection and >>0.5 indicates more serious disease.  As the procalcitonin level normalizes, it will be reasonable to consider de-escalation of antibiotic coverage. -Blood and urine cultures pending -Will admit due to: hemodynamic instability; hypoxemia;  AMS that is severe or persistent; failure of outpatient treatment -Will trend lactate to ensure improvement -Will admit to SDU for now, with close monitoring for decompensation  CP with ID -Baseline MS is unknown other than h/o ID  Epilepsy -Continue Keppra, Valproic acid  HTN -Hold Cardizem, Lopressor given recurrent hypotension while in the ER (volume responsive) -Hold Lasix  DM -Will check A1c -He appears to only be on SSI at home -Cover with moderate-scale SSI    Note: This patient has been tested and is negative for the novel coronavirus COVID-19.  However, this will be repeated to ensure accuracy of the test.   DVT prophylaxis:  Lovenox Code Status:  DNR - confirmed with family Family Communication: None present; I spoke with his nephew/guardian by telephone Disposition Plan:  SNF once clinically improved Consults called: None  Admission status: Admit - It is my clinical opinion that admission to INPATIENT is reasonable and necessary because of the expectation that this patient will require hospital care that crosses at least 2 midnights to treat this condition based on the medical complexity of the problems presented.  Given the aforementioned information, the predictability of an adverse outcome is felt to be significant.    Karmen Bongo MD [facility]   How to contact the [facility] Attending or Consulting provider [place] or covering provider during after hours [place], for this patient?  1. Check the care team in [facility] and  look for a) attending/consulting [facility] provider listed and b) the [facility] team listed 2. Log into [email or web address] and use [facility] universal password to access. If you do not have the password, please contact the hospital operator. 3. Locate the [facility] provider you are looking for under [facility] and page to a number that you can be directly reached. 4. If you still have difficulty reaching the provider, please page the [facility] (Director on Call) for the Hospitalists listed on amion for assistance.   03/05/2019, 1:33 PM

## 2019-03-05 NOTE — ED Notes (Signed)
Patient placed in a hospital bed.  Scrotum very swollen peri care given, patient  And patient placed on his right side  With pillows for comfort.

## 2019-03-05 NOTE — Progress Notes (Signed)
Pharmacy Antibiotic Note  Roy Crawford is a 63 y.o. male admitted on 03/05/2019 with pneumonia.  Pharmacy has been consulted for Vancomycin/Cefepime dosing. WBC is elevated. Scr has doubled from baseline (0.47 last month, now 0.98).   Pt was started on Vancomycin at [facility] 2 days ago. A random vancomycin level this morning is 15.   Plan: Vancomycin 1250 mg IV q24h (slight decrease from recommended AUC dosing) >>May be better off using traditional dosing in pt with low muscle mass whose SCr has doubled from baseline Cefepime 2g IV q8h Trend WBC, temp, renal function  F/U infectious work-up Drug levels as indicated  Temp (24hrs), Avg:104 F (40 C), Min:104 F (40 C), Max:104 F (40 C)  Recent Labs  Lab 03/05/19 0329 03/05/19 0331  WBC 20.9*  --   CREATININE 0.98  --   LATICACIDVEN  --  3.3*  VANCORANDOM  --  15    CrCl cannot be calculated (Unknown ideal weight.).    Not on [place], PharmD, [place] Pharmacist Phone: [phone]

## 2019-03-05 NOTE — ED Notes (Signed)
Per MAR from facility, 1g tylenol given @ 2330.

## 2019-03-05 NOTE — ED Notes (Signed)
Regular hospital bed ordered 

## 2019-03-06 DIAGNOSIS — Z930 Tracheostomy status: Secondary | ICD-10-CM

## 2019-03-06 DIAGNOSIS — J189 Pneumonia, unspecified organism: Secondary | ICD-10-CM

## 2019-03-06 DIAGNOSIS — F79 Unspecified intellectual disabilities: Secondary | ICD-10-CM

## 2019-03-06 DIAGNOSIS — B965 Pseudomonas (aeruginosa) (mallei) (pseudomallei) as the cause of diseases classified elsewhere: Secondary | ICD-10-CM

## 2019-03-06 DIAGNOSIS — G808 Other cerebral palsy: Secondary | ICD-10-CM

## 2019-03-06 DIAGNOSIS — A419 Sepsis, unspecified organism: Secondary | ICD-10-CM

## 2019-03-06 DIAGNOSIS — G40909 Epilepsy, unspecified, not intractable, without status epilepticus: Secondary | ICD-10-CM

## 2019-03-06 DIAGNOSIS — R7881 Bacteremia: Secondary | ICD-10-CM

## 2019-03-06 LAB — BASIC METABOLIC PANEL
Anion gap: 8 (ref 5–15)
BUN: 30 mg/dL — ABNORMAL HIGH (ref 8–23)
CO2: 28 mmol/L (ref 22–32)
Calcium: 8.8 mg/dL — ABNORMAL LOW (ref 8.9–10.3)
Chloride: 109 mmol/L (ref 98–111)
Creatinine, Ser: 0.5 mg/dL — ABNORMAL LOW (ref 0.61–1.24)
GFR calc Af Amer: >60 mL/min (ref >60)
GFR calc non Af Amer: >60 mL/min (ref >60)
Glucose, Bld: 155 mg/dL — ABNORMAL HIGH (ref 70–99)
Potassium: 3.1 mmol/L — ABNORMAL LOW (ref 3.5–5.1)
Sodium: 145 mmol/L (ref 135–145)

## 2019-03-06 LAB — CBC
HCT: 27.6 % — ABNORMAL LOW (ref 39.0–52.0)
Hemoglobin: 8.6 g/dL — ABNORMAL LOW (ref 13.0–17.0)
MCH: 28.3 pg (ref 26.0–34.0)
MCHC: 31.2 g/dL (ref 30.0–36.0)
MCV: 90.8 fL (ref 80.0–100.0)
Platelets: 226 10*3/uL (ref 150–400)
RBC: 3.04 MIL/uL — ABNORMAL LOW (ref 4.22–5.81)
RDW: 17 % — ABNORMAL HIGH (ref 11.5–15.5)
WBC: 12.3 10*3/uL — ABNORMAL HIGH (ref 4.0–10.5)
nRBC: 0 % (ref 0.0–0.2)

## 2019-03-06 LAB — BLOOD CULTURE ID PANEL (REFLEXED)

## 2019-03-06 LAB — MAGNESIUM: Magnesium: 1.9 mg/dL (ref 1.7–2.4)

## 2019-03-06 LAB — MRSA PCR SCREENING: MRSA by PCR: NEGATIVE

## 2019-03-06 LAB — GLUCOSE, CAPILLARY
Glucose-Capillary: 193 mg/dL — ABNORMAL HIGH (ref 70–99)
Glucose-Capillary: 183 mg/dL — ABNORMAL HIGH (ref 70–99)
Glucose-Capillary: 147 mg/dL — ABNORMAL HIGH (ref 70–99)
Glucose-Capillary: 155 mg/dL — ABNORMAL HIGH (ref 70–99)

## 2019-03-06 LAB — SARS CORONAVIRUS 2 (TAT 6-24 HRS): SARS Coronavirus 2: NEGATIVE

## 2019-03-06 MED ORDER — POTASSIUM CHLORIDE 20 MEQ/15ML (10%) PO SOLN
40.0000 meq | ORAL | Status: AC
  Administered 2019-03-06 (×2): 40 meq
  Filled 2019-03-06: qty 30

## 2019-03-06 NOTE — Progress Notes (Signed)
Patient ID: Roy Crawford, male   DOB: Sep 14, 1955, 63 y.o.   MRN: [id number]  PROGRESS NOTE    Roy Crawford  [id number] DOB: Jul 26, 1955 DOA: 03/05/2019 PCP: [name], MD   Brief Narrative:  63 year old male with history of cerebral palsy with intellectual disability, epilepsy, chronic contractures, glaucoma, hyperlipidemia and trach/PEG dependence presented on 03/05/2019 with refractory pneumonia from [facility].  He was sent over because of low blood pressure 80/40.  Patient had increased secretions and was diagnosed with pneumonia 2 days ago prior to presentation and was treated with vancomycin and Rocephin.  He continued to have fevers with increasing hypoxia and was sent to ED for evaluation.  He required 10 L oxygen via trach on presentation.  X-ray showed right lower lobe pneumonia.  He was started on broad-spectrum antibiotics.  Assessment & Plan:   Acute hypoxic respiratory failure in a patient who is tracheostomy dependent Sepsis: Present on admission Pneumonia: With concern for gram-negative rod pneumonia versus MRSA pneumonia versus aspiration pneumonia Leukocytosis -Patient was started on broad-spectrum antibiotics cefepime and vancomycin.  Initial Covid testing was negative.  Repeat Covid testing is pending. -BCID growing Pseudomonas, subsequently vancomycin discontinued.  Continue cefepime. -Respiratory panel PCR negative. -Spoke to PCCM/Dr. Ander Slade and discussed the case with him.  For now we will continue supplemental oxygen via trach.  If respiratory status gets worse or if there is a problem with tracheostomy, will get PCCM involved officially.  Pseudomonas bacteremia -Probably from pneumonia. -LFTs noted elevated on presentation.  Continue cefepime.  Repeat a.m. labs including LFTs.  Hypokalemia -Replace.  Repeat a.m. labs.  Monitor magnesium  Cerebral palsy with intellectual disability Epilepsy Chronic contractures Tracheostomy/PEG dependence  -Overall prognosis is very poor. -Continue Keppra, valproic acid.  Continue tube feeding. -Palliative care evaluation for goals of care  Hypertension -Initially hypotensive in the ED.  Blood pressure improving.  Will keep Cardizem, Lopressor and Lasix on hold.  Diabetes mellitus type 2 -Continue CBGs with SSI.  A1c 5.3  DVT prophylaxis: Lovenox Code Status: DNR Family Communication: None at bedside Disposition Plan: SNF/[facility] once clinically improved  Consultants: Spoke to PCCM/Dr. Ander Slade on phone on 03/06/2019  Procedures: None  Antimicrobials: Cefepime from 03/05/2019 onwards Vancomycin 03/04/2020 dose   Subjective: Patient seen and examined at bedside.  He is awake but does not follow commands.  No overnight fever or vomiting reported.  Objective: Vitals:   03/06/19 0720 03/06/19 0730 03/06/19 0750 03/06/19 0810  BP:   138/77   Pulse: 94 91 (!) 101 88  Resp: (!) 21 (!) 24 (!) 24 20  Temp:   97.6 F (36.4 C)   TempSrc:   Axillary   SpO2: 96% 95% 96% 99%  Weight:        Intake/Output Summary (Last 24 hours) at 03/06/2019 1032 Last data filed at 03/06/2019 0730 Gross per 24 hour  Intake 2784.88 ml  Output 880 ml  Net 1904.88 ml   Filed Weights   03/05/19 1430 03/05/19 1502  Weight: 84.1 kg 84.1 kg    Examination:  General exam: Appears calm and comfortable.  Chronically ill. ENT: Trach in place. Respiratory system: Bilateral decreased breath sounds at bases with scattered crackles Cardiovascular system: S1 & S2 heard, intermittently tachycardic Gastrointestinal system: Abdomen is nondistended, soft and nontender.  G-tube in place.  Normal bowel sounds heard. Extremities: No cyanosis, clubbing; trace edema.  Chronic lower extremity skin changes present Central nervous system: Awake, does not answer questions or follow commands.  Chronic  contractures present. Skin: No other rashes, lesions or ulcers Psychiatry: Could not be assessed because of mental  status.    Data Reviewed: I have personally reviewed following labs and imaging studies  CBC: Recent Labs  Lab 03/05/19 0329 03/05/19 0622 03/06/19 0946  WBC 20.9*  --  12.3*  NEUTROABS 16.5*  --   --   HGB 11.2* 7.8* 8.6*  HCT 35.2* 23.0* 27.6*  MCV 89.6  --  90.8  PLT 330  --  [id number]   Basic Metabolic Panel: Recent Labs  Lab 03/05/19 0329 03/05/19 0622 03/06/19 0946  NA 133* 138 145  K 4.3 3.7 3.1*  CL 93*  --  109  CO2 28  --  28  GLUCOSE 90  --  155*  BUN 45*  --  30*  CREATININE 0.98  --  0.50*  CALCIUM 9.2  --  8.8*   GFR: CrCl cannot be calculated (Unknown ideal weight.). Liver Function Tests: Recent Labs  Lab 03/05/19 0329  AST 43*  ALT 22  ALKPHOS 125  BILITOT 1.0  PROT 6.7  ALBUMIN 2.2*   No results for input(s): LIPASE, AMYLASE in the last 168 hours. No results for input(s): AMMONIA in the last 168 hours. Coagulation Profile: Recent Labs  Lab 03/05/19 0329  INR 1.4*   Cardiac Enzymes: No results for input(s): CKTOTAL, CKMB, CKMBINDEX, TROPONINI in the last 168 hours. BNP (last 3 results) No results for input(s): PROBNP in the last 8760 hours. HbA1C: Recent Labs    03/05/19 1833  HGBA1C 5.3   CBG: Recent Labs  Lab 03/05/19 1151 03/05/19 2107 03/06/19 0741  GLUCAP 93 115* 193*   Lipid Profile: No results for input(s): CHOL, HDL, LDLCALC, TRIG, CHOLHDL, LDLDIRECT in the last 72 hours. Thyroid Function Tests: No results for input(s): TSH, T4TOTAL, FREET4, T3FREE, THYROIDAB in the last 72 hours. Anemia Panel: Recent Labs    03/05/19 1830  FERRITIN 318   Sepsis Labs: Recent Labs  Lab 03/05/19 0331 03/05/19 0643 03/05/19 1830 03/05/19 2130  PROCALCITON  --   --  18.59  --   LATICACIDVEN 3.3* 2.2* 1.1 0.9    Recent Results (from the past 240 hour(s))  Blood Culture (routine x 2)     Status: None (Preliminary result)   Collection Time: 03/05/19  3:32 AM   Specimen: BLOOD LEFT HAND  Result Value Ref Range Status    Specimen Description BLOOD LEFT HAND  Final   Special Requests   Final    BOTTLES DRAWN AEROBIC AND ANAEROBIC Blood Culture adequate volume   Culture  Setup Time   Final    GRAM NEGATIVE RODS AEROBIC BOTTLE ONLY Organism ID to follow CRITICAL RESULT CALLED TO, READ BACK BY AND VERIFIED WITH: PHRMD J LEDFORD @0343  03/06/19 BY S GEZAHEGN    Culture   Final    CULTURE REINCUBATED FOR BETTER GROWTH Performed at [facility], [place] [address] [place], [place] [address]    Report Status PENDING  Incomplete  Blood Culture ID Panel (Reflexed)     Status: Abnormal   Collection Time: 03/05/19  3:32 AM  Result Value Ref Range Status   Enterococcus species NOT DETECTED NOT DETECTED Final   Listeria monocytogenes NOT DETECTED NOT DETECTED Final   Staphylococcus species NOT DETECTED NOT DETECTED Final   Staphylococcus aureus (BCID) NOT DETECTED NOT DETECTED Final   Streptococcus species NOT DETECTED NOT DETECTED Final   Streptococcus agalactiae NOT DETECTED NOT DETECTED Final   Streptococcus pneumoniae NOT  DETECTED NOT DETECTED Final   Streptococcus pyogenes NOT DETECTED NOT DETECTED Final   Acinetobacter baumannii NOT DETECTED NOT DETECTED Final   Enterobacteriaceae species NOT DETECTED NOT DETECTED Final   Enterobacter cloacae complex NOT DETECTED NOT DETECTED Final   Escherichia coli NOT DETECTED NOT DETECTED Final   Klebsiella oxytoca NOT DETECTED NOT DETECTED Final   Klebsiella pneumoniae NOT DETECTED NOT DETECTED Final   Proteus species NOT DETECTED NOT DETECTED Final   Serratia marcescens NOT DETECTED NOT DETECTED Final   Carbapenem resistance NOT DETECTED NOT DETECTED Final   Haemophilus influenzae NOT DETECTED NOT DETECTED Final   Neisseria meningitidis NOT DETECTED NOT DETECTED Final   Pseudomonas aeruginosa DETECTED (A) NOT DETECTED Final    Comment: CRITICAL RESULT CALLED TO, READ BACK BY AND VERIFIED WITH: PHRMD J LEDFORD @0343  03/06/19 BY S GEZAHEGN    Candida albicans  NOT DETECTED NOT DETECTED Final   Candida glabrata NOT DETECTED NOT DETECTED Final   Candida krusei NOT DETECTED NOT DETECTED Final   Candida parapsilosis NOT DETECTED NOT DETECTED Final   Candida tropicalis NOT DETECTED NOT DETECTED Final    Comment: Performed at [facility], [place]. [address] [place], [place] [address]  SARS CORONAVIRUS 2 (TAT 6-24 HRS) Nasopharyngeal Nasopharyngeal Swab     Status: None   Collection Time: 03/05/19  5:36 AM   Specimen: Nasopharyngeal Swab  Result Value Ref Range Status   SARS Coronavirus 2 NEGATIVE NEGATIVE Final    Comment: (NOTE) SARS-CoV-2 target nucleic acids are NOT DETECTED. The SARS-CoV-2 RNA is generally detectable in upper and lower respiratory specimens during the acute phase of infection. Negative results do not preclude SARS-CoV-2 infection, do not rule out co-infections with other pathogens, and should not be used as the sole basis for treatment or other patient management decisions. Negative results must be combined with clinical observations, patient history, and epidemiological information. The expected result is Negative. Fact Sheet for Patients: SugarRoll.be Fact Sheet for Healthcare Providers: https://www.woods-mathews.com/ This test is not yet approved or cleared by the [place] FDA and  has been authorized for detection and/or diagnosis of SARS-CoV-2 by FDA under an Emergency Use Authorization (EUA). This EUA will remain  in effect (meaning this test can be used) for the duration of the COVID-19 declaration under Section 56 4(b)(1) of the Act, 21 U.S.C. section 360bbb-3(b)(1), unless the authorization is terminated or revoked sooner. Performed at [facility], [place] [address] [place], [place] [address]   Urine culture     Status: None (Preliminary result)   Collection Time: 03/05/19  6:15 AM   Specimen: In/Out Cath Urine  Result Value Ref Range Status   Specimen  Description IN/OUT CATH URINE  Final   Special Requests NONE  Final   Culture   Final    CULTURE REINCUBATED FOR BETTER GROWTH Performed at [facility], [place] [address] [place], [place] [address]    Report Status PENDING  Incomplete  Blood Culture (routine x 2)     Status: None (Preliminary result)   Collection Time: 03/05/19  9:32 AM   Specimen: BLOOD RIGHT HAND  Result Value Ref Range Status   Specimen Description BLOOD RIGHT HAND  Final   Special Requests   Final    BOTTLES DRAWN AEROBIC AND ANAEROBIC Blood Culture adequate volume   Culture   Final    NO GROWTH < 24 HOURS Performed at [facility], [place] [address], [place], [place] [address]  Report Status PENDING  Incomplete  Respiratory Panel by PCR     Status: None   Collection Time: 03/05/19  7:30 PM   Specimen: Nasopharyngeal Swab; Respiratory  Result Value Ref Range Status   Adenovirus NOT DETECTED NOT DETECTED Final   Coronavirus 229E NOT DETECTED NOT DETECTED Final    Comment: (NOTE) The Coronavirus on the Respiratory Panel, DOES NOT test for the novel  Coronavirus (2019 nCoV)    Coronavirus HKU1 NOT DETECTED NOT DETECTED Final   Coronavirus NL63 NOT DETECTED NOT DETECTED Final   Coronavirus OC43 NOT DETECTED NOT DETECTED Final   Metapneumovirus NOT DETECTED NOT DETECTED Final   Rhinovirus / Enterovirus NOT DETECTED NOT DETECTED Final   Influenza A NOT DETECTED NOT DETECTED Final   Influenza B NOT DETECTED NOT DETECTED Final   Parainfluenza Virus 1 NOT DETECTED NOT DETECTED Final   Parainfluenza Virus 2 NOT DETECTED NOT DETECTED Final   Parainfluenza Virus 3 NOT DETECTED NOT DETECTED Final   Parainfluenza Virus 4 NOT DETECTED NOT DETECTED Final   Respiratory Syncytial Virus NOT DETECTED NOT DETECTED Final   Bordetella pertussis NOT DETECTED NOT DETECTED Final   Chlamydophila pneumoniae NOT DETECTED NOT DETECTED Final   Mycoplasma pneumoniae NOT DETECTED NOT DETECTED Final    Comment: Performed at  [facility], [address], [place], [place] [address]         Radiology Studies: Dg Chest Port 1 View  Result Date: 03/05/2019 CLINICAL DATA:  Shortness of breath EXAM: PORTABLE CHEST 1 VIEW COMPARISON:  January 15, 2019 FINDINGS: There is mild cardiomegaly. Tracheostomy tube seen at the level of clavicular heads. There is mildly increased interstitial markings throughout both lungs. Streaky opacity seen at the right lung base and left upper lung. IMPRESSION: Nonspecific increased interstitial markings and patchy airspace opacity at the right lung base. This could be due to early infectious etiology Electronically Signed   By: Prudencio Pair M.D.   On: 03/05/2019 03:30        Scheduled Meds: . chlorhexidine  15 mL Mouth/Throat BID  . Chlorhexidine Gluconate Cloth  6 each Topical Daily  . enoxaparin (LOVENOX) injection  40 mg Subcutaneous Q24H  . insulin aspart  0-9 Units Subcutaneous TID WC  . levETIRAcetam  1,000 mg Per Tube BID  . magnesium oxide  400 mg Per Tube Daily  . methylPREDNISolone (SOLU-MEDROL) injection  0.5 mg/kg Intravenous Q12H  . metoCLOPramide  5 mg Per Tube QID  . pantoprazole sodium  40 mg Per Tube Daily  . sodium chloride flush  3 mL Intravenous Q12H  . sodium chloride flush  3 mL Intravenous Q12H  . valproic acid  1,000 mg Per Tube TID   Continuous Infusions: . sodium chloride Stopped (03/06/19 0915)  . ceFEPime (MAXIPIME) IV 2 g (03/06/19 0540)  . feeding supplement (VITAL HIGH PROTEIN) 55 mL/hr (03/06/19 0700)          Aline August, MD [facility] 03/06/2019, 10:32 AM

## 2019-03-06 NOTE — Progress Notes (Signed)
PHARMACY - PHYSICIAN COMMUNICATION CRITICAL VALUE ALERT - BLOOD CULTURE IDENTIFICATION (BCID)  Roy Crawford is an 63 y.o. male who presented to [facility] on 03/05/2019 with a chief complaint of PNA  Name of physician (or Provider) Contacted: Drf Alekh  Current antibiotics: Vancomycin/Cefepime  Changes to prescribed antibiotics recommended:  Continue cefepime  DC Vanc per protocol  Results for orders placed or performed during the hospital encounter of 03/05/19  Blood Culture ID Panel (Reflexed) (Collected: 03/05/2019  3:32 AM)  Result Value Ref Range   Enterococcus species NOT DETECTED NOT DETECTED   Listeria monocytogenes NOT DETECTED NOT DETECTED   Staphylococcus species NOT DETECTED NOT DETECTED   Staphylococcus aureus (BCID) NOT DETECTED NOT DETECTED   Streptococcus species NOT DETECTED NOT DETECTED   Streptococcus agalactiae NOT DETECTED NOT DETECTED   Streptococcus pneumoniae NOT DETECTED NOT DETECTED   Streptococcus pyogenes NOT DETECTED NOT DETECTED   Acinetobacter baumannii NOT DETECTED NOT DETECTED   Enterobacteriaceae species NOT DETECTED NOT DETECTED   Enterobacter cloacae complex NOT DETECTED NOT DETECTED   Escherichia coli NOT DETECTED NOT DETECTED   Klebsiella oxytoca NOT DETECTED NOT DETECTED   Klebsiella pneumoniae NOT DETECTED NOT DETECTED   Proteus species NOT DETECTED NOT DETECTED   Serratia marcescens NOT DETECTED NOT DETECTED   Carbapenem resistance NOT DETECTED NOT DETECTED   Haemophilus influenzae NOT DETECTED NOT DETECTED   Neisseria meningitidis NOT DETECTED NOT DETECTED   Pseudomonas aeruginosa DETECTED (A) NOT DETECTED   Candida albicans NOT DETECTED NOT DETECTED   Candida glabrata NOT DETECTED NOT DETECTED   Candida krusei NOT DETECTED NOT DETECTED   Candida parapsilosis NOT DETECTED NOT DETECTED   Candida tropicalis NOT DETECTED NOT DETECTED   Levester Fresh, PharmD, BCPS, BCCCP Clinical Pharmacist [phone]  Please check AMION for  all [place] numbers  03/06/2019 7:48 AM

## 2019-03-06 NOTE — Progress Notes (Signed)
..    Vital Signs MEWS/VS Documentation      03/06/2019 1645 03/06/2019 1740 03/06/2019 1900 03/06/2019 2057   MEWS Score:  2  1  3  2    MEWS Score Color:  Yellow  Green  Yellow  Yellow   Resp:  (!) 24  20  (!) 28  (!) 24   Pulse:  91  97  -  97   BP:  130/75  -  (!) 156/82  (!) 156/82   Temp:  98.1 F (36.7 C)  -  98 F (36.7 C)  -   O2 Device:  Tracheostomy Collar  -  Tracheostomy Collar  Tracheostomy Collar   O2 Flow Rate (L/min):  -  -  10 L/min  -   FiO2 (%):  -  -  40 %  40 %      Patient has been assessed.  He is resting comfortably.  Patient is non-verbal and intellectually disabled.       Jacqulyn Ducking 03/06/2019,11:13 PM

## 2019-03-06 NOTE — Progress Notes (Signed)
PHARMACY - PHYSICIAN COMMUNICATION CRITICAL VALUE ALERT - BLOOD CULTURE IDENTIFICATION (BCID)  Roy Crawford is an 63 y.o. male who presented to [facility] on 03/05/2019 with a chief complaint of PNA   Name of physician (or Provider) Contacted: X Blount ([facility])  Current antibiotics: Vancomycin/Cefepime  Changes to prescribed antibiotics recommended:  No changes for now  Results for orders placed or performed during the hospital encounter of 03/05/19  Blood Culture ID Panel (Reflexed) (Collected: 03/05/2019  3:32 AM)  Result Value Ref Range   Enterococcus species NOT DETECTED NOT DETECTED   Listeria monocytogenes NOT DETECTED NOT DETECTED   Staphylococcus species NOT DETECTED NOT DETECTED   Staphylococcus aureus (BCID) NOT DETECTED NOT DETECTED   Streptococcus species NOT DETECTED NOT DETECTED   Streptococcus agalactiae NOT DETECTED NOT DETECTED   Streptococcus pneumoniae NOT DETECTED NOT DETECTED   Streptococcus pyogenes NOT DETECTED NOT DETECTED   Acinetobacter baumannii NOT DETECTED NOT DETECTED   Enterobacteriaceae species NOT DETECTED NOT DETECTED   Enterobacter cloacae complex NOT DETECTED NOT DETECTED   Escherichia coli NOT DETECTED NOT DETECTED   Klebsiella oxytoca NOT DETECTED NOT DETECTED   Klebsiella pneumoniae NOT DETECTED NOT DETECTED   Proteus species NOT DETECTED NOT DETECTED   Serratia marcescens NOT DETECTED NOT DETECTED   Carbapenem resistance NOT DETECTED NOT DETECTED   Haemophilus influenzae NOT DETECTED NOT DETECTED   Neisseria meningitidis NOT DETECTED NOT DETECTED   Pseudomonas aeruginosa DETECTED (A) NOT DETECTED   Candida albicans NOT DETECTED NOT DETECTED   Candida glabrata NOT DETECTED NOT DETECTED   Candida krusei NOT DETECTED NOT DETECTED   Candida parapsilosis NOT DETECTED NOT DETECTED   Candida tropicalis NOT DETECTED NOT DETECTED    Roy Crawford 03/06/2019  4:16 AM

## 2019-03-07 DIAGNOSIS — J9601 Acute respiratory failure with hypoxia: Secondary | ICD-10-CM

## 2019-03-07 LAB — CBC WITH DIFFERENTIAL/PLATELET
Abs Immature Granulocytes: 0.07 10*3/uL (ref 0.00–0.07)
Basophils Absolute: 0 10*3/uL (ref 0.0–0.1)
Basophils Relative: 0 %
Eosinophils Absolute: 0 10*3/uL (ref 0.0–0.5)
Eosinophils Relative: 0 %
HCT: 27.3 % — ABNORMAL LOW (ref 39.0–52.0)
Hemoglobin: 8.5 g/dL — ABNORMAL LOW (ref 13.0–17.0)
Immature Granulocytes: 1 %
Lymphocytes Relative: 6 %
Lymphs Abs: 0.7 10*3/uL (ref 0.7–4.0)
MCH: 28.2 pg (ref 26.0–34.0)
MCHC: 31.1 g/dL (ref 30.0–36.0)
MCV: 90.7 fL (ref 80.0–100.0)
Monocytes Absolute: 0.5 10*3/uL (ref 0.1–1.0)
Monocytes Relative: 4 %
Neutro Abs: 11 10*3/uL — ABNORMAL HIGH (ref 1.7–7.7)
Neutrophils Relative %: 89 %
Platelets: 251 10*3/uL (ref 150–400)
RBC: 3.01 MIL/uL — ABNORMAL LOW (ref 4.22–5.81)
RDW: 17 % — ABNORMAL HIGH (ref 11.5–15.5)
WBC Morphology: INCREASED
WBC: 12.4 10*3/uL — ABNORMAL HIGH (ref 4.0–10.5)
nRBC: 0.2 % (ref 0.0–0.2)

## 2019-03-07 LAB — COMPREHENSIVE METABOLIC PANEL
ALT: 29 U/L (ref 0–44)
AST: 44 U/L — ABNORMAL HIGH (ref 15–41)
Albumin: 1.6 g/dL — ABNORMAL LOW (ref 3.5–5.0)
Alkaline Phosphatase: 133 U/L — ABNORMAL HIGH (ref 38–126)
Anion gap: 6 (ref 5–15)
BUN: 33 mg/dL — ABNORMAL HIGH (ref 8–23)
CO2: 28 mmol/L (ref 22–32)
Calcium: 9 mg/dL (ref 8.9–10.3)
Chloride: 114 mmol/L — ABNORMAL HIGH (ref 98–111)
Creatinine, Ser: 0.46 mg/dL — ABNORMAL LOW (ref 0.61–1.24)
GFR calc Af Amer: >60 mL/min (ref >60)
GFR calc non Af Amer: >60 mL/min (ref >60)
Glucose, Bld: 139 mg/dL — ABNORMAL HIGH (ref 70–99)
Potassium: 4 mmol/L (ref 3.5–5.1)
Sodium: 148 mmol/L — ABNORMAL HIGH (ref 135–145)
Total Bilirubin: 0.3 mg/dL (ref 0.3–1.2)
Total Protein: 5.5 g/dL — ABNORMAL LOW (ref 6.5–8.1)

## 2019-03-07 LAB — GLUCOSE, CAPILLARY
Glucose-Capillary: 138 mg/dL — ABNORMAL HIGH (ref 70–99)
Glucose-Capillary: 179 mg/dL — ABNORMAL HIGH (ref 70–99)
Glucose-Capillary: 121 mg/dL — ABNORMAL HIGH (ref 70–99)
Glucose-Capillary: 131 mg/dL — ABNORMAL HIGH (ref 70–99)

## 2019-03-07 LAB — MAGNESIUM: Magnesium: 1.9 mg/dL (ref 1.7–2.4)

## 2019-03-07 MED ORDER — LORAZEPAM 2 MG/ML IJ SOLN: 2.0000 mg | INTRAMUSCULAR | Status: DC | PRN

## 2019-03-07 NOTE — Progress Notes (Signed)
Patient ID: Roy Crawford, male   DOB: 13-Feb-1956, 63 y.o.   MRN: [id number]  PROGRESS NOTE    Roy Crawford  [id number] DOB: [birth year] DOA: 03/05/2019 PCP: [name], MD   Brief Narrative:  63 year old male with history of cerebral palsy with intellectual disability, epilepsy, chronic contractures, glaucoma, hyperlipidemia and trach/PEG dependence presented on 03/05/2019 with refractory pneumonia from [facility].  He was sent over because of low blood pressure 80/40.  Patient had increased secretions and was diagnosed with pneumonia 2 days ago prior to presentation and was treated with vancomycin and Rocephin.  He continued to have fevers with increasing hypoxia and was sent to ED for evaluation.  He required 10 L oxygen via trach on presentation.  X-ray showed right lower lobe pneumonia.  He was started on broad-spectrum antibiotics.  Assessment & Plan:   Acute hypoxic respiratory failure in a patient who is tracheostomy dependent Sepsis: Present on admission Pneumonia: With concern for gram-negative rod pneumonia versus MRSA pneumonia versus aspiration pneumonia Leukocytosis -Patient was started on broad-spectrum antibiotics cefepime and vancomycin.  Initial Covid testing was negative.  Repeat Covid testing was negative as well.  COVID-19 isolation discontinued. -BCID growing Pseudomonas, subsequently vancomycin discontinued.  Continue cefepime. -Respiratory panel PCR negative. -Spoke to PCCM/Dr. Ander Slade on 03/06/2019 on phone and discussed the case with him.  For now we will continue supplemental oxygen via trach.  If respiratory status gets worse or if there is a problem with tracheostomy, will get PCCM involved officially. -Currently requiring oxygen at 8 L/min via trach  -Monitor labs.  DC IV fluids.  Pseudomonas bacteremia -Probably from pneumonia. -LFTs noted elevated on presentation.  Continue cefepime.  LFTs have not trended up.  Hypokalemia -Improved     cerebral palsy with intellectual disability Epilepsy Chronic contractures Tracheostomy/PEG dependence -Overall prognosis is very poor. -Continue Keppra, valproic acid.  Continue tube feeding. -Palliative care evaluation for goals of care  Hypertension -Initially hypotensive in the ED.  Blood pressure improving but still on the lower side.  Will keep Cardizem, Lopressor and Lasix on hold.  Will resume Lasix if blood pressure remains stable today.  Diabetes mellitus type 2 -Continue CBGs with SSI.  A1c 5.3  DVT prophylaxis: Lovenox Code Status: DNR Family Communication: None at bedside Disposition Plan: SNF/[facility] once clinically improved  Consultants: Spoke to PCCM/Dr. Ander Slade on phone on 03/06/2019  Procedures: None  Antimicrobials: Cefepime from 03/05/2019 onwards Vancomycin 03/04/2020 dose   Subjective: Patient seen and examined at bedside.  Very poor historian.  He is sleepy, wakes up slightly, does not follow commands.  No overnight vomiting or fever reported by nursing staff.  Objective: Vitals:   03/06/19 2057 03/06/19 2318 03/07/19 0310 03/07/19 0734  BP: (!) 156/82 105/64 (!) 105/52 119/65  Pulse: 97 92 80 86  Resp: (!) 24 (!) 22 (!) 22 (!) 23  Temp:  98.1 F (36.7 C) 98.1 F (36.7 C) 98.9 F (37.2 C)  TempSrc:  Axillary Axillary Axillary  SpO2:  99% 97% 96%  Weight:        Intake/Output Summary (Last 24 hours) at 03/07/2019 0809 Last data filed at 03/06/2019 1900 Gross per 24 hour  Intake 1385 ml  Output 1050 ml  Net 335 ml   Filed Weights   03/05/19 1430 03/05/19 1502  Weight: 84.1 kg 84.1 kg    Examination:  General exam: No distress.  Looks chronically ill.  Sleepy, wakes up slightly, does not follow commands ENT: Trach in place. Respiratory  system: Bilateral decreased breath sounds at bases, no wheezing.  Some crackles.  Intermittent tachypnea cardiovascular system: S1 & S2 heard, rate controlled  gastrointestinal system: Abdomen is  nondistended, soft and nontender.  G-tube in place.  Normal bowel sounds heard. Extremities: No cyanosis, clubbing; trace edema.  Chronic lower extremity skin changes present Central nervous system: Sleepy, wakes up slightly, does not follow commands  Chronic contractures present. Skin: No other rashes, lesions or ulcers Psychiatry: Could not be assessed because of his mental status    Data Reviewed: I have personally reviewed following labs and imaging studies  CBC: Recent Labs  Lab 03/05/19 0329 03/05/19 0622 03/06/19 0946 03/07/19 0436  WBC 20.9*  --  12.3* 12.4*  NEUTROABS 16.5*  --   --  PENDING  HGB 11.2* 7.8* 8.6* 8.5*  HCT 35.2* 23.0* 27.6* 27.3*  MCV 89.6  --  90.8 90.7  PLT 330  --  226 [id number]   Basic Metabolic Panel: Recent Labs  Lab 03/05/19 0329 03/05/19 0622 03/06/19 0946 03/06/19 1043 03/07/19 0436  NA 133* 138 145  --  148*  K 4.3 3.7 3.1*  --  4.0  CL 93*  --  109  --  114*  CO2 28  --  28  --  28  GLUCOSE 90  --  155*  --  139*  BUN 45*  --  30*  --  33*  CREATININE 0.98  --  0.50*  --  0.46*  CALCIUM 9.2  --  8.8*  --  9.0  MG  --   --   --  1.9 1.9   GFR: CrCl cannot be calculated (Unknown ideal weight.). Liver Function Tests: Recent Labs  Lab 03/05/19 0329 03/07/19 0436  AST 43* 44*  ALT 22 29  ALKPHOS 125 133*  BILITOT 1.0 0.3  PROT 6.7 5.5*  ALBUMIN 2.2* 1.6*   No results for input(s): LIPASE, AMYLASE in the last 168 hours. No results for input(s): AMMONIA in the last 168 hours. Coagulation Profile: Recent Labs  Lab 03/05/19 0329  INR 1.4*   Cardiac Enzymes: No results for input(s): CKTOTAL, CKMB, CKMBINDEX, TROPONINI in the last 168 hours. BNP (last 3 results) No results for input(s): PROBNP in the last 8760 hours. HbA1C: Recent Labs    03/05/19 1833  HGBA1C 5.3   CBG: Recent Labs  Lab 03/06/19 0741 03/06/19 1140 03/06/19 1646 03/06/19 2119 03/07/19 0732  GLUCAP 193* 183* 147* 155* 138*   Lipid Profile: No  results for input(s): CHOL, HDL, LDLCALC, TRIG, CHOLHDL, LDLDIRECT in the last 72 hours. Thyroid Function Tests: No results for input(s): TSH, T4TOTAL, FREET4, T3FREE, THYROIDAB in the last 72 hours. Anemia Panel: Recent Labs    03/05/19 1830  FERRITIN 318   Sepsis Labs: Recent Labs  Lab 03/05/19 0331 03/05/19 0643 03/05/19 1830 03/05/19 2130  PROCALCITON  --   --  18.59  --   LATICACIDVEN 3.3* 2.2* 1.1 0.9    Recent Results (from the past 240 hour(s))  Blood Culture (routine x 2)     Status: None (Preliminary result)   Collection Time: 03/05/19  3:32 AM   Specimen: BLOOD LEFT HAND  Result Value Ref Range Status   Specimen Description BLOOD LEFT HAND  Final   Special Requests   Final    BOTTLES DRAWN AEROBIC AND ANAEROBIC Blood Culture adequate volume   Culture  Setup Time   Final    GRAM NEGATIVE RODS AEROBIC BOTTLE ONLY Organism ID to follow CRITICAL  RESULT CALLED TO, READ BACK BY AND VERIFIED WITH: PHRMD J LEDFORD @0343  03/06/19 BY S GEZAHEGN    Culture   Final    CULTURE REINCUBATED FOR BETTER GROWTH Performed at [facility], [place] [address] [place], [place] [address]    Report Status PENDING  Incomplete  Blood Culture ID Panel (Reflexed)     Status: Abnormal   Collection Time: 03/05/19  3:32 AM  Result Value Ref Range Status   Enterococcus species NOT DETECTED NOT DETECTED Final   Listeria monocytogenes NOT DETECTED NOT DETECTED Final   Staphylococcus species NOT DETECTED NOT DETECTED Final   Staphylococcus aureus (BCID) NOT DETECTED NOT DETECTED Final   Streptococcus species NOT DETECTED NOT DETECTED Final   Streptococcus agalactiae NOT DETECTED NOT DETECTED Final   Streptococcus pneumoniae NOT DETECTED NOT DETECTED Final   Streptococcus pyogenes NOT DETECTED NOT DETECTED Final   Acinetobacter baumannii NOT DETECTED NOT DETECTED Final   Enterobacteriaceae species NOT DETECTED NOT DETECTED Final   Enterobacter cloacae complex NOT DETECTED NOT DETECTED  Final   Escherichia coli NOT DETECTED NOT DETECTED Final   Klebsiella oxytoca NOT DETECTED NOT DETECTED Final   Klebsiella pneumoniae NOT DETECTED NOT DETECTED Final   Proteus species NOT DETECTED NOT DETECTED Final   Serratia marcescens NOT DETECTED NOT DETECTED Final   Carbapenem resistance NOT DETECTED NOT DETECTED Final   Haemophilus influenzae NOT DETECTED NOT DETECTED Final   Neisseria meningitidis NOT DETECTED NOT DETECTED Final   Pseudomonas aeruginosa DETECTED (A) NOT DETECTED Final    Comment: CRITICAL RESULT CALLED TO, READ BACK BY AND VERIFIED WITH: PHRMD J LEDFORD @0343  03/06/19 BY S GEZAHEGN    Candida albicans NOT DETECTED NOT DETECTED Final   Candida glabrata NOT DETECTED NOT DETECTED Final   Candida krusei NOT DETECTED NOT DETECTED Final   Candida parapsilosis NOT DETECTED NOT DETECTED Final   Candida tropicalis NOT DETECTED NOT DETECTED Final    Comment: Performed at [facility], [address] [place], [place] [address]  SARS CORONAVIRUS 2 (TAT 6-24 HRS) Nasopharyngeal Nasopharyngeal Swab     Status: None   Collection Time: 03/05/19  5:36 AM   Specimen: Nasopharyngeal Swab  Result Value Ref Range Status   SARS Coronavirus 2 NEGATIVE NEGATIVE Final    Comment: (NOTE) SARS-CoV-2 target nucleic acids are NOT DETECTED. The SARS-CoV-2 RNA is generally detectable in upper and lower respiratory specimens during the acute phase of infection. Negative results do not preclude SARS-CoV-2 infection, do not rule out co-infections with other pathogens, and should not be used as the sole basis for treatment or other patient management decisions. Negative results must be combined with clinical observations, patient history, and epidemiological information. The expected result is Negative. Fact Sheet for Patients: SugarRoll.be Fact Sheet for Healthcare Providers: https://www.woods-mathews.com/ This test is not yet approved or  cleared by the [place] FDA and  has been authorized for detection and/or diagnosis of SARS-CoV-2 by FDA under an Emergency Use Authorization (EUA). This EUA will remain  in effect (meaning this test can be used) for the duration of the COVID-19 declaration under Section 56 4(b)(1) of the Act, 21 U.S.C. section 360bbb-3(b)(1), unless the authorization is terminated or revoked sooner. Performed at [facility], [place] [address] [place], [place] [address]   Urine culture     Status: None (Preliminary result)   Collection Time: 03/05/19  6:15 AM   Specimen: In/Out Cath Urine  Result Value Ref Range Status   Specimen  Description IN/OUT CATH URINE  Final   Special Requests NONE  Final   Culture   Final    CULTURE REINCUBATED FOR BETTER GROWTH Performed at [facility], [place] [address], [place], [place] [address]    Report Status PENDING  Incomplete  Blood Culture (routine x 2)     Status: None (Preliminary result)   Collection Time: 03/05/19  9:32 AM   Specimen: BLOOD RIGHT HAND  Result Value Ref Range Status   Specimen Description BLOOD RIGHT HAND  Final   Special Requests   Final    BOTTLES DRAWN AEROBIC AND ANAEROBIC Blood Culture adequate volume   Culture   Final    NO GROWTH < 24 HOURS Performed at [facility], [place] [address] [place], [place] [address]    Report Status PENDING  Incomplete  Respiratory Panel by PCR     Status: None   Collection Time: 03/05/19  7:30 PM   Specimen: Nasopharyngeal Swab; Respiratory  Result Value Ref Range Status   Adenovirus NOT DETECTED NOT DETECTED Final   Coronavirus 229E NOT DETECTED NOT DETECTED Final    Comment: (NOTE) The Coronavirus on the Respiratory Panel, DOES NOT test for the novel  Coronavirus (2019 nCoV)    Coronavirus HKU1 NOT DETECTED NOT DETECTED Final   Coronavirus NL63 NOT DETECTED NOT DETECTED Final   Coronavirus OC43 NOT DETECTED NOT DETECTED Final   Metapneumovirus NOT DETECTED NOT DETECTED Final    Rhinovirus / Enterovirus NOT DETECTED NOT DETECTED Final   Influenza A NOT DETECTED NOT DETECTED Final   Influenza B NOT DETECTED NOT DETECTED Final   Parainfluenza Virus 1 NOT DETECTED NOT DETECTED Final   Parainfluenza Virus 2 NOT DETECTED NOT DETECTED Final   Parainfluenza Virus 3 NOT DETECTED NOT DETECTED Final   Parainfluenza Virus 4 NOT DETECTED NOT DETECTED Final   Respiratory Syncytial Virus NOT DETECTED NOT DETECTED Final   Bordetella pertussis NOT DETECTED NOT DETECTED Final   Chlamydophila pneumoniae NOT DETECTED NOT DETECTED Final   Mycoplasma pneumoniae NOT DETECTED NOT DETECTED Final    Comment: Performed at [facility], [place]. [address], [place], [place] [address]  SARS CORONAVIRUS 2 (TAT 6-24 HRS) Nasopharyngeal Nasopharyngeal Swab     Status: None   Collection Time: 03/06/19  5:00 AM   Specimen: Nasopharyngeal Swab  Result Value Ref Range Status   SARS Coronavirus 2 NEGATIVE NEGATIVE Final    Comment: (NOTE) SARS-CoV-2 target nucleic acids are NOT DETECTED. The SARS-CoV-2 RNA is generally detectable in upper and lower respiratory specimens during the acute phase of infection. Negative results do not preclude SARS-CoV-2 infection, do not rule out co-infections with other pathogens, and should not be used as the sole basis for treatment or other patient management decisions. Negative results must be combined with clinical observations, patient history, and epidemiological information. The expected result is Negative. Fact Sheet for Patients: SugarRoll.be Fact Sheet for Healthcare Providers: https://www.woods-mathews.com/ This test is not yet approved or cleared by the [place] FDA and  has been authorized for detection and/or diagnosis of SARS-CoV-2 by FDA under an Emergency Use Authorization (EUA). This EUA will remain  in effect (meaning this test can be used) for the duration of the COVID-19 declaration under  Section 56 4(b)(1) of the Act, 21 U.S.C. section 360bbb-3(b)(1), unless the authorization is terminated or revoked sooner. Performed at [facility], [place] [address], [place], [place] [address]   MRSA PCR Screening  Status: None   Collection Time: 03/06/19  7:32 AM   Specimen: Nasopharyngeal  Result Value Ref Range Status   MRSA by PCR NEGATIVE NEGATIVE Final    Comment:        The GeneXpert MRSA Assay (FDA approved for NASAL specimens only), is one component of a comprehensive MRSA colonization surveillance program. It is not intended to diagnose MRSA infection nor to guide or monitor treatment for MRSA infections. Performed at [facility], [place] [address] [place], [place] [address]          Radiology Studies: No results found.      Scheduled Meds: . chlorhexidine  15 mL Mouth/Throat BID  . Chlorhexidine Gluconate Cloth  6 each Topical Daily  . enoxaparin (LOVENOX) injection  40 mg Subcutaneous Q24H  . insulin aspart  0-9 Units Subcutaneous TID WC  . levETIRAcetam  1,000 mg Per Tube BID  . magnesium oxide  400 mg Per Tube Daily  . methylPREDNISolone (SOLU-MEDROL) injection  0.5 mg/kg Intravenous Q12H  . metoCLOPramide  5 mg Per Tube QID  . pantoprazole sodium  40 mg Per Tube Daily  . sodium chloride flush  3 mL Intravenous Q12H  . sodium chloride flush  3 mL Intravenous Q12H  . valproic acid  1,000 mg Per Tube TID   Continuous Infusions: . sodium chloride 50 mL/hr at 03/06/19 1736  . ceFEPime (MAXIPIME) IV 2 g (03/06/19 2241)  . feeding supplement (VITAL HIGH PROTEIN) 55 mL/hr (03/06/19 1800)          Aline August, MD [facility] 03/07/2019, 8:09 AM

## 2019-03-07 NOTE — NC FL2 (Signed)
[place] LEVEL OF CARE SCREENING TOOL     IDENTIFICATION  Patient Name: Roy Crawford Birthdate: 1955-12-04 Sex: male Admission Date (Current Location): 03/05/2019  [place] and [place] Number:  Kathleen Argue [id number] [place] and Address:  [facility] [place], [place] [address], [place],  [address]      Provider Number: [id number]  Attending Physician Name and Address:  Aline August, MD  Relative Name and Phone Number:  [place], Moff[place] [phone]    Current Level of Care: Hospital Recommended Level of Care: [place] Prior Approval Number:    Date Approved/Denied:   PASRR Number: [id number]  Discharge Plan: SNF    Current Diagnoses: Patient Active Problem List   Diagnosis Date Noted  . Acute respiratory failure with hypoxia ([place]) 03/05/2019  . RLL pneumonia 03/05/2019  . Sepsis due to pneumonia ([place]) 03/05/2019  . Tracheostomy dependent ([facility])   . Intellectual disability   . Glaucoma   . Epilepsy ([place])   . Dyslipidemia   . Cerebral palsy, quadriplegic (HCC)     Orientation RESPIRATION BLADDER Height & Weight     (Disorientated x4)  Tracheostomy(65mm cuffed trach, Fi02 35, Sp02 94, 8L) Incontinent, Indwelling catheter Weight: 185 lb 6.5 oz (84.1 kg) Height:     BEHAVIORAL SYMPTOMS/MOOD NEUROLOGICAL BOWEL NUTRITION STATUS      Incontinent Feeding tube(Peg tube, NPO)  AMBULATORY STATUS COMMUNICATION OF NEEDS Skin   Total Care Verbally Normal(Dry skin)                       Personal Care Assistance Level of Assistance  Bathing, Feeding, Dressing, Total care Bathing Assistance: Maximum assistance Feeding assistance: Maximum assistance Dressing Assistance: Maximum assistance Total Care Assistance: Maximum assistance   Functional Limitations Info  Sight, Hearing, Speech Sight Info: Adequate Hearing Info: Adequate Speech Info: Adequate    SPECIAL CARE FACTORS FREQUENCY  OT (By licensed  OT)       OT Frequency: 5x/wk            Contractures Contractures Info: Not present    Additional Factors Info  Psychotropic Code Status Info: DNR Allergies Info: No known allergies Psychotropic Info: depakene 250mg /30ml solution 1000mg  3x daily Insulin Sliding Scale Info: insulin aspart novolog 0-9 units 3x daily w/meals       Current Medications (03/07/2019):  This is the current hospital active medication list Current Facility-Administered Medications  Medication Dose Route Frequency Provider Last Rate Last Dose  . acetaminophen (TYLENOL) tablet 650 mg  650 mg Oral Q6H PRN Karmen Bongo, MD   650 mg at 03/07/19 [id number]   Or  . acetaminophen (TYLENOL) suppository 650 mg  650 mg Rectal Q6H PRN Karmen Bongo, MD      . ceFEPIme (MAXIPIME) 2 g in sodium chloride 0.9 % 100 mL IVPB  2 g Intravenous Lynne Logan, MD 200 mL/hr at 03/06/19 2241 2 g at 03/06/19 2241  . chlorhexidine (PERIDEX) 0.12 % solution 15 mL  15 mL Mouth/Throat BID Karmen Bongo, MD   15 mL at 03/07/19 0949  . Chlorhexidine Gluconate Cloth 2 % PADS 6 each  6 each Topical Daily Shela Leff, MD   6 each at 03/07/19 1447  . enoxaparin (LOVENOX) injection 40 mg  40 mg Subcutaneous Q24H Karmen Bongo, MD   40 mg at 03/07/19 0949  . feeding supplement (VITAL HIGH PROTEIN) liquid  55 mL/hr Per Tube Continuous Shela Leff, MD 55 mL/hr at 03/06/19 1800 55 mL/hr at  03/06/19 1800  . insulin aspart (novoLOG) injection 0-9 Units  0-9 Units Subcutaneous TID WC Karmen Bongo, MD   1 Units at 03/06/19 1734  . lactulose (CHRONULAC) 10 GM/15ML solution 10 g  10 g Per Tube Daily PRN Karmen Bongo, MD      . levETIRAcetam (KEPPRA) 100 MG/ML solution 1,000 mg  1,000 mg Per Tube BID Karmen Bongo, MD   1,000 mg at 03/07/19 0949  . LORazepam (ATIVAN) injection 2 mg  2 mg Intravenous Q2H PRN Alekh, Kshitiz, MD      . magnesium oxide (MAG-OX) tablet 400 mg  400 mg Per Tube Daily Karmen Bongo, MD   400 mg  at 03/07/19 0949  . methylPREDNISolone sodium succinate (SOLU-MEDROL) 125 mg/2 mL injection 41.875 mg  0.5 mg/kg Intravenous Lillia Mountain, MD   41.875 mg at 03/07/19 0206  . metoCLOPramide (REGLAN) tablet 5 mg  5 mg Per Tube QID Karmen Bongo, MD   5 mg at 03/07/19 0949  . ondansetron (ZOFRAN) tablet 4 mg  4 mg Oral Q6H PRN Karmen Bongo, MD       Or  . ondansetron [facility]) injection 4 mg  4 mg Intravenous Q6H PRN Karmen Bongo, MD   4 mg at 03/05/19 1740  . pantoprazole sodium (PROTONIX) 40 mg/20 mL oral suspension 40 mg  40 mg Per Tube Daily Karmen Bongo, MD   40 mg at 03/07/19 0949  . polyethylene glycol (MIRALAX / GLYCOLAX) packet 17 g  17 g Per Tube Daily PRN Karmen Bongo, MD      . sodium chloride flush (NS) 0.9 % injection 3 mL  3 mL Intravenous Q12H Karmen Bongo, MD   3 mL at 03/06/19 2238  . sodium chloride flush (NS) 0.9 % injection 3 mL  3 mL Intravenous Lillia Mountain, MD   3 mL at 03/06/19 2237  . valproic acid (DEPAKENE) 250 MG/5ML solution 1,000 mg  1,000 mg Per Tube TID Karmen Bongo, MD   1,000 mg at 03/07/19 [id number]     Discharge Medications: Please see discharge summary for a list of discharge medications.  Relevant Imaging Results:  Relevant Lab Results:   Additional Information SSN: [id number]; COVID negative on 03/05/2019  Graviela Nodal B Norris [place], LCSWA

## 2019-03-07 NOTE — Progress Notes (Addendum)
CSW attempted to call the patient's emergency contact and complete TOC assessment, Rommie Simonin. CSW left a message and is awaiting a return phone call.   CSW will continue to follow.   Domenic Schwab, MSW, [place] Worker [facility]  [phone]

## 2019-03-07 NOTE — NC FL2 (Deleted)
[place] LEVEL OF CARE SCREENING TOOL     IDENTIFICATION  Patient Name: Roy Crawford Birthdate: 1955-05-18 Sex: male Admission Date (Current Location): 03/05/2019  [place] and [place] Number:  Kathleen Argue [id number] [place] and Address:  [facility] [place], [place] [address], [place], [place] [address]      Provider Number: [id number]  Attending Physician Name and Address:  Aline August, MD  Relative Name and Phone Number:  Izick, Hagey [phone]    Current Level of Care: Hospital Recommended Level of Care: [place] Prior Approval Number:    Date Approved/Denied:   PASRR Number: [id number]  Discharge Plan: SNF    Current Diagnoses: Patient Active Problem List   Diagnosis Date Noted  . Acute respiratory failure with hypoxia ([place]) 03/05/2019  . RLL pneumonia 03/05/2019  . Sepsis due to pneumonia ([place]) 03/05/2019  . Tracheostomy dependent ([place])   . Intellectual disability   . Glaucoma   . Epilepsy ([place])   . Dyslipidemia   . Cerebral palsy, quadriplegic (HCC)     Orientation RESPIRATION BLADDER Height & Weight     (Disorientated x4)  Tracheostomy(46mm cuffed trach, Fi02 35, Sp02 94, 8L) Incontinent, Indwelling catheter Weight: 185 lb 6.5 oz (84.1 kg) Height:     BEHAVIORAL SYMPTOMS/MOOD NEUROLOGICAL BOWEL NUTRITION STATUS      Incontinent Feeding tube(Peg tube, NPO)  AMBULATORY STATUS COMMUNICATION OF NEEDS Skin   Total Care Verbally Normal(Dry skin)                       Personal Care Assistance Level of Assistance  Bathing, Feeding, Dressing, Total care Bathing Assistance: Maximum assistance Feeding assistance: Maximum assistance Dressing Assistance: Maximum assistance Total Care Assistance: Maximum assistance   Functional Limitations Info  Sight, Hearing, Speech Sight Info: Adequate Hearing Info: Adequate Speech Info: Adequate    SPECIAL CARE FACTORS FREQUENCY  OT (By licensed  OT)       OT Frequency: 5x/wk            Contractures Contractures Info: Not present    Additional Factors Info  Code Status, Allergies, Insulin Sliding Scale Code Status Info: DNR Allergies Info: No known allergies   Insulin Sliding Scale Info: insulin aspart novolog 0-9 units 3x daily w/meals       Current Medications (03/07/2019):  This is the current hospital active medication list Current Facility-Administered Medications  Medication Dose Route Frequency Provider Last Rate Last Dose  . acetaminophen (TYLENOL) tablet 650 mg  650 mg Oral Q6H PRN Karmen Bongo, MD   650 mg at 03/07/19 [id number]   Or  . acetaminophen (TYLENOL) suppository 650 mg  650 mg Rectal Q6H PRN Karmen Bongo, MD      . ceFEPIme (MAXIPIME) 2 g in sodium chloride 0.9 % 100 mL IVPB  2 g Intravenous Lynne Logan, MD 200 mL/hr at 03/06/19 2241 2 g at 03/06/19 2241  . chlorhexidine (PERIDEX) 0.12 % solution 15 mL  15 mL Mouth/Throat BID Karmen Bongo, MD   15 mL at 03/07/19 0949  . Chlorhexidine Gluconate Cloth 2 % PADS 6 each  6 each Topical Daily Shela Leff, MD   6 each at 03/07/19 1447  . enoxaparin (LOVENOX) injection 40 mg  40 mg Subcutaneous Q24H Karmen Bongo, MD   40 mg at 03/07/19 0949  . feeding supplement (VITAL HIGH PROTEIN) liquid  55 mL/hr Per Tube Continuous Shela Leff, MD 55 mL/hr at 03/06/19 1800 55 mL/hr at 03/06/19  1800  . insulin aspart (novoLOG) injection 0-9 Units  0-9 Units Subcutaneous TID WC Karmen Bongo, MD   1 Units at 03/06/19 1734  . lactulose (CHRONULAC) 10 GM/15ML solution 10 g  10 g Per Tube Daily PRN Karmen Bongo, MD      . levETIRAcetam (KEPPRA) 100 MG/ML solution 1,000 mg  1,000 mg Per Tube BID Karmen Bongo, MD   1,000 mg at 03/07/19 0949  . LORazepam (ATIVAN) injection 2 mg  2 mg Intravenous Q2H PRN Alekh, Kshitiz, MD      . magnesium oxide (MAG-OX) tablet 400 mg  400 mg Per Tube Daily Karmen Bongo, MD   400 mg at 03/07/19 0949  .  methylPREDNISolone sodium succinate (SOLU-MEDROL) 125 mg/2 mL injection 41.875 mg  0.5 mg/kg Intravenous Lillia Mountain, MD   41.875 mg at 03/07/19 0206  . metoCLOPramide (REGLAN) tablet 5 mg  5 mg Per Tube QID Karmen Bongo, MD   5 mg at 03/07/19 0949  . ondansetron (ZOFRAN) tablet 4 mg  4 mg Oral Q6H PRN Karmen Bongo, MD       Or  . ondansetron [facility]) injection 4 mg  4 mg Intravenous Q6H PRN Karmen Bongo, MD   4 mg at 03/05/19 1740  . pantoprazole sodium (PROTONIX) 40 mg/20 mL oral suspension 40 mg  40 mg Per Tube Daily Karmen Bongo, MD   40 mg at 03/07/19 0949  . polyethylene glycol (MIRALAX / GLYCOLAX) packet 17 g  17 g Per Tube Daily PRN Karmen Bongo, MD      . sodium chloride flush (NS) 0.9 % injection 3 mL  3 mL Intravenous Q12H Karmen Bongo, MD   3 mL at 03/06/19 2238  . sodium chloride flush (NS) 0.9 % injection 3 mL  3 mL Intravenous Lillia Mountain, MD   3 mL at 03/06/19 2237  . valproic acid (DEPAKENE) 250 MG/5ML solution 1,000 mg  1,000 mg Per Tube TID Karmen Bongo, MD   1,000 mg at 03/07/19 [id number]     Discharge Medications: Please see discharge summary for a list of discharge medications.  Relevant Imaging Results:  Relevant Lab Results:   Additional Information SSN: [id number]; COVID negative on 03/05/2019  Pretty Weltman B [place], LCSWA

## 2019-03-07 NOTE — Progress Notes (Signed)
Palliative:  Consult received and chart reviewed - attempted to call only person listed in chart - Roy Crawford - no answer, left voicemail with callback number. Will attempt again tomorrow. If contact is established with any decision makers, please alert palliative medicine team.   Thank you for this consult.  Juel Burrow, DNP, AGNP-C Palliative Medicine Team Team Phone # [phone]  Pager # [phone]  NO CHARGE

## 2019-03-08 LAB — URINE CULTURE: Culture: >=100000 — AB

## 2019-03-08 LAB — GLUCOSE, CAPILLARY: Glucose-Capillary: 126 mg/dL — ABNORMAL HIGH (ref 70–99)

## 2019-03-08 MED ORDER — ENALAPRILAT 1.25 MG/ML IV SOLN
0.6250 mg | Freq: Once | INTRAVENOUS | Status: AC
  Administered 2019-03-08: 0.625 mg via INTRAVENOUS
  Filled 2019-03-08: qty 0.5

## 2019-03-08 MED ORDER — METOPROLOL TARTRATE 5 MG/5ML IV SOLN: 5.0000 mg | Freq: Four times a day (QID) | INTRAVENOUS | Status: DC | PRN

## 2019-03-08 MED ORDER — METOPROLOL TARTRATE 5 MG/5ML IV SOLN: 2.5000 mg | Freq: Four times a day (QID) | INTRAVENOUS | Status: DC | PRN

## 2019-03-08 MED ORDER — METOPROLOL TARTRATE 25 MG PO TABS
25.0000 mg | ORAL_TABLET | Freq: Two times a day (BID) | ORAL | Status: DC
  Administered 2019-03-08: 25 mg
  Filled 2019-03-08: qty 1

## 2019-03-08 MED ORDER — NYSTATIN 100000 UNIT/GM EX POWD
1.0000 g | Freq: Two times a day (BID) | CUTANEOUS | Status: DC
  Filled 2019-03-08: qty 15

## 2019-03-08 MED ORDER — SODIUM CHLORIDE 0.9 % IV SOLN: 2.0000 g | Freq: Three times a day (TID) | INTRAVENOUS | Status: AC

## 2019-03-08 MED ORDER — FUROSEMIDE 20 MG PO TABS
20.0000 mg | ORAL_TABLET | Freq: Every day | ORAL | Status: DC
  Administered 2019-03-08: 20 mg
  Filled 2019-03-08: qty 1

## 2019-03-08 NOTE — Progress Notes (Signed)
OT Cancellation Note  Patient Details Name: Roy Crawford MRN: [id number] DOB: [birth year]   Cancelled Treatment:    Reason Eval/Treat Not Completed: OT screened, no needs identified, will sign off Pt is a long-term resident at SNF. Per LCSW, pt will be returning to SNF. No acute OT needs identified at this time. OT to sign off. Thank you for referral.  Dorinda Hill OTR/L Acute Rehabilitation Services Office: [place] 03/08/2019, 10:19 AM

## 2019-03-08 NOTE — Care Management Important Message (Addendum)
Important Message  Patient Details  Name: Roy Crawford MRN: [id number] Date of Birth: 12/05/1955   Medicare Important Message Given:  Yes  Correction no im given patient has been discharged [facility] 03/08/2019, 2:46 PM

## 2019-03-08 NOTE — Consult Note (Signed)
[place] Nurse wound consult note Patient receiving care in [facility] 2W06. Reason for Consult: "skin breakdown and open peri areas around foley catheter" Wound type: No actual wounds.  There are areas of hypopigmentation, but these are not wounds.  The area has some very slight moisture impact only.  Dressing procedure/placement/frequency:  No dressing or topical approach needed other than routine foley care. Thank you for the consult.  Discussed plan of care with the patient and bedside nurse.  [place] nurse will not follow at this time.  Please re-consult the [place] team if needed.  Val Riles, RN, MSN, CWOCN, CNS-BC, pager [phone]

## 2019-03-08 NOTE — Progress Notes (Signed)
Patient ID: Roy Crawford, male   DOB: 13-Nov-1955, 63 y.o.   MRN: [id number]  PROGRESS NOTE    BARTLOMIEJ BURRS  [id number] DOB: 10-30-1955 DOA: 03/05/2019 PCP: [name], MD   Brief Narrative:  63 year old male with history of cerebral palsy with intellectual disability, epilepsy, chronic contractures, glaucoma, hyperlipidemia and trach/PEG dependence presented on 03/05/2019 with refractory pneumonia from [facility].  He was sent over because of low blood pressure 80/40.  Patient had increased secretions and was diagnosed with pneumonia 2 days ago prior to presentation and was treated with vancomycin and Rocephin.  He continued to have fevers with increasing hypoxia and was sent to ED for evaluation.  He required 10 L oxygen via trach on presentation.  X-ray showed right lower lobe pneumonia.  He was started on broad-spectrum antibiotics.  Assessment & Plan:   Acute hypoxic respiratory failure in a patient who is tracheostomy dependent Sepsis: Present on admission Pneumonia: With concern for gram-negative rod pneumonia versus MRSA pneumonia versus aspiration pneumonia Leukocytosis -Patient was started on broad-spectrum antibiotics cefepime and vancomycin.  Initial Covid testing was negative.  Repeat Covid testing was negative as well.  COVID-19 isolation discontinued. -BCID growing Pseudomonas, subsequently vancomycin discontinued.  Continue cefepime. -Respiratory panel PCR negative. -Spoke to PCCM/Dr. Ander Slade on 03/06/2019 on phone and discussed the case with him.  For now we will continue supplemental oxygen via trach.  If respiratory status gets worse or if there is a problem with tracheostomy, will get PCCM involved officially. -Currently requiring oxygen at 8 L/min via trach  -Labs pending for today.  Monitor  Pseudomonas bacteremia -Probably from pneumonia.  Susceptibilities pending. -LFTs noted elevated on presentation.  Continue cefepime.  LFTs have not trended up.   Hypokalemia -Improved.  No labs today.  cerebral palsy with intellectual disability Epilepsy Chronic contractures Tracheostomy/PEG dependence -Overall prognosis is very poor. -Continue Keppra, valproic acid.  Continue tube feeding. -Palliative care evaluation for goals of care  Hypertension -Initially hypotensive in the ED.  Cardizem, Lopressor and Lasix on hold.  Blood pressure on the high side.  Will have to resume antihypertensives.    Diabetes mellitus type 2 -Continue CBGs with SSI.  A1c 5.3  DVT prophylaxis: Lovenox Code Status: DNR Family Communication: None at bedside Disposition Plan: SNF/[facility] once clinically improved  Consultants: Spoke to PCCM/Dr. Ander Slade on phone on 03/06/2019  Procedures: None  Antimicrobials: Cefepime from 03/05/2019 onwards Vancomycin 03/04/2020 dose   Subjective: Patient seen and examined at bedside.  Very poor historian.  Slightly wakes up, does not follow commands.  No overnight vomiting or fever reported by nursing staff.  Objective: Vitals:   03/07/19 1634 03/07/19 1934 03/07/19 2307 03/08/19 0333  BP: (!) 159/85 (!) 160/84 (!) 154/79 (!) 147/100  Pulse: 92 85 84 97  Resp: 18 19 20  (!) 22  Temp: 98.8 F (37.1 C) 98.8 F (37.1 C) 98.8 F (37.1 C) 97.9 F (36.6 C)  TempSrc: Axillary Axillary Axillary Axillary  SpO2: 95% 97% 100% 98%  Weight:        Intake/Output Summary (Last 24 hours) at 03/08/2019 0718 Last data filed at 03/07/2019 2200 Gross per 24 hour  Intake 5 ml  Output 1250 ml  Net -1245 ml   Filed Weights   03/05/19 1430 03/05/19 1502  Weight: 84.1 kg 84.1 kg    Examination:  General exam: No acute distress.  Looks chronically ill.  Wakes up slightly but does  not follow commands ENT: Tracheostomy in place. Respiratory system:  Bilateral decreased breath sounds at bases with scattered crackles.  Intermittent tachypnea  cardiovascular system: Rate controlled, S1-S2 heard gastrointestinal system: Abdomen  is nondistended, soft and nontender.  G-tube in place.  Normal bowel sounds heard. Extremities: No cyanosis; trace edema.  Chronic lower extremity skin changes present   Data Reviewed: I have personally reviewed following labs and imaging studies  CBC: Recent Labs  Lab 03/05/19 0329 03/05/19 0622 03/06/19 0946 03/07/19 0436  WBC 20.9*  --  12.3* 12.4*  NEUTROABS 16.5*  --   --  11.0*  HGB 11.2* 7.8* 8.6* 8.5*  HCT 35.2* 23.0* 27.6* 27.3*  MCV 89.6  --  90.8 90.7  PLT 330  --  226 [id number]   Basic Metabolic Panel: Recent Labs  Lab 03/05/19 0329 03/05/19 0622 03/06/19 0946 03/06/19 1043 03/07/19 0436  NA 133* 138 145  --  148*  K 4.3 3.7 3.1*  --  4.0  CL 93*  --  109  --  114*  CO2 28  --  28  --  28  GLUCOSE 90  --  155*  --  139*  BUN 45*  --  30*  --  33*  CREATININE 0.98  --  0.50*  --  0.46*  CALCIUM 9.2  --  8.8*  --  9.0  MG  --   --   --  1.9 1.9   GFR: CrCl cannot be calculated (Unknown ideal weight.). Liver Function Tests: Recent Labs  Lab 03/05/19 0329 03/07/19 0436  AST 43* 44*  ALT 22 29  ALKPHOS 125 133*  BILITOT 1.0 0.3  PROT 6.7 5.5*  ALBUMIN 2.2* 1.6*   No results for input(s): LIPASE, AMYLASE in the last 168 hours. No results for input(s): AMMONIA in the last 168 hours. Coagulation Profile: Recent Labs  Lab 03/05/19 0329  INR 1.4*   Cardiac Enzymes: No results for input(s): CKTOTAL, CKMB, CKMBINDEX, TROPONINI in the last 168 hours. BNP (last 3 results) No results for input(s): PROBNP in the last 8760 hours. HbA1C: Recent Labs    03/05/19 1833  HGBA1C 5.3   CBG: Recent Labs  Lab 03/06/19 2119 03/07/19 0732 03/07/19 1136 03/07/19 1632 03/07/19 2131  GLUCAP 155* 138* 179* 121* 131*   Lipid Profile: No results for input(s): CHOL, HDL, LDLCALC, TRIG, CHOLHDL, LDLDIRECT in the last 72 hours. Thyroid Function Tests: No results for input(s): TSH, T4TOTAL, FREET4, T3FREE, THYROIDAB in the last 72 hours. Anemia Panel: Recent Labs     03/05/19 1830  FERRITIN 318   Sepsis Labs: Recent Labs  Lab 03/05/19 0331 03/05/19 0643 03/05/19 1830 03/05/19 2130  PROCALCITON  --   --  18.59  --   LATICACIDVEN 3.3* 2.2* 1.1 0.9    Recent Results (from the past 240 hour(s))  Blood Culture (routine x 2)     Status: Abnormal (Preliminary result)   Collection Time: 03/05/19  3:32 AM   Specimen: BLOOD LEFT HAND  Result Value Ref Range Status   Specimen Description BLOOD LEFT HAND  Final   Special Requests   Final    BOTTLES DRAWN AEROBIC AND ANAEROBIC Blood Culture adequate volume   Culture  Setup Time   Final    GRAM NEGATIVE RODS AEROBIC BOTTLE ONLY CRITICAL RESULT CALLED TO, READ BACK BY AND VERIFIED WITH: PHRMD J LEDFORD @0343  03/06/19 BY S GEZAHEGN Performed at [facility], [place] [address], [place], [place] [address]    Culture PSEUDOMONAS AERUGINOSA (A)  Final   Report Status  PENDING  Incomplete  Blood Culture ID Panel (Reflexed)     Status: Abnormal   Collection Time: 03/05/19  3:32 AM  Result Value Ref Range Status   Enterococcus species NOT DETECTED NOT DETECTED Final   Listeria monocytogenes NOT DETECTED NOT DETECTED Final   Staphylococcus species NOT DETECTED NOT DETECTED Final   Staphylococcus aureus (BCID) NOT DETECTED NOT DETECTED Final   Streptococcus species NOT DETECTED NOT DETECTED Final   Streptococcus agalactiae NOT DETECTED NOT DETECTED Final   Streptococcus pneumoniae NOT DETECTED NOT DETECTED Final   Streptococcus pyogenes NOT DETECTED NOT DETECTED Final   Acinetobacter baumannii NOT DETECTED NOT DETECTED Final   Enterobacteriaceae species NOT DETECTED NOT DETECTED Final   Enterobacter cloacae complex NOT DETECTED NOT DETECTED Final   Escherichia coli NOT DETECTED NOT DETECTED Final   Klebsiella oxytoca NOT DETECTED NOT DETECTED Final   Klebsiella pneumoniae NOT DETECTED NOT DETECTED Final   Proteus species NOT DETECTED NOT DETECTED Final   Serratia marcescens NOT DETECTED NOT DETECTED  Final   Carbapenem resistance NOT DETECTED NOT DETECTED Final   Haemophilus influenzae NOT DETECTED NOT DETECTED Final   Neisseria meningitidis NOT DETECTED NOT DETECTED Final   Pseudomonas aeruginosa DETECTED (A) NOT DETECTED Final    Comment: CRITICAL RESULT CALLED TO, READ BACK BY AND VERIFIED WITH: PHRMD J LEDFORD @0343  03/06/19 BY S GEZAHEGN    Candida albicans NOT DETECTED NOT DETECTED Final   Candida glabrata NOT DETECTED NOT DETECTED Final   Candida krusei NOT DETECTED NOT DETECTED Final   Candida parapsilosis NOT DETECTED NOT DETECTED Final   Candida tropicalis NOT DETECTED NOT DETECTED Final    Comment: Performed at [facility], [address] [place], [place] [address]  SARS CORONAVIRUS 2 (TAT 6-24 HRS) Nasopharyngeal Nasopharyngeal Swab     Status: None   Collection Time: 03/05/19  5:36 AM   Specimen: Nasopharyngeal Swab  Result Value Ref Range Status   SARS Coronavirus 2 NEGATIVE NEGATIVE Final    Comment: (NOTE) SARS-CoV-2 target nucleic acids are NOT DETECTED. The SARS-CoV-2 RNA is generally detectable in upper and lower respiratory specimens during the acute phase of infection. Negative results do not preclude SARS-CoV-2 infection, do not rule out co-infections with other pathogens, and should not be used as the sole basis for treatment or other patient management decisions. Negative results must be combined with clinical observations, patient history, and epidemiological information. The expected result is Negative. Fact Sheet for Patients: SugarRoll.be Fact Sheet for Healthcare Providers: https://www.woods-mathews.com/ This test is not yet approved or cleared by the [place] FDA and  has been authorized for detection and/or diagnosis of SARS-CoV-2 by FDA under an Emergency Use Authorization (EUA). This EUA will remain  in effect (meaning this test can be used) for the duration of the COVID-19 declaration under  Section 56 4(b)(1) of the Act, 21 U.S.C. section 360bbb-3(b)(1), unless the authorization is terminated or revoked sooner. Performed at [facility], [place] [address], [place], [place] [address]   Urine culture     Status: Abnormal (Preliminary result)   Collection Time: 03/05/19  6:15 AM   Specimen: In/Out Cath Urine  Result Value Ref Range Status   Specimen Description IN/OUT CATH URINE  Final   Special Requests NONE  Final   Culture (A)  Final    >=100,000 COLONIES/mL PSEUDOMONAS AERUGINOSA SUSCEPTIBILITIES TO FOLLOW Performed at [facility], [place] [address] [place], [place] [address]    Report Status PENDING  Incomplete  Blood Culture (routine x 2)     Status: None (Preliminary result)   Collection Time: 03/05/19  9:32 AM   Specimen: BLOOD RIGHT HAND  Result Value Ref Range Status   Specimen Description BLOOD RIGHT HAND  Final   Special Requests   Final    BOTTLES DRAWN AEROBIC AND ANAEROBIC Blood Culture adequate volume   Culture   Final    NO GROWTH 2 DAYS Performed at [facility], [address] [place], [place] [address]    Report Status PENDING  Incomplete  Respiratory Panel by PCR     Status: None   Collection Time: 03/05/19  7:30 PM   Specimen: Nasopharyngeal Swab; Respiratory  Result Value Ref Range Status   Adenovirus NOT DETECTED NOT DETECTED Final   Coronavirus 229E NOT DETECTED NOT DETECTED Final    Comment: (NOTE) The Coronavirus on the Respiratory Panel, DOES NOT test for the novel  Coronavirus (2019 nCoV)    Coronavirus HKU1 NOT DETECTED NOT DETECTED Final   Coronavirus NL63 NOT DETECTED NOT DETECTED Final   Coronavirus OC43 NOT DETECTED NOT DETECTED Final   Metapneumovirus NOT DETECTED NOT DETECTED Final   Rhinovirus / Enterovirus NOT DETECTED NOT DETECTED Final   Influenza A NOT DETECTED NOT DETECTED Final   Influenza B NOT DETECTED NOT DETECTED Final   Parainfluenza Virus 1 NOT DETECTED NOT DETECTED Final   Parainfluenza Virus  2 NOT DETECTED NOT DETECTED Final   Parainfluenza Virus 3 NOT DETECTED NOT DETECTED Final   Parainfluenza Virus 4 NOT DETECTED NOT DETECTED Final   Respiratory Syncytial Virus NOT DETECTED NOT DETECTED Final   Bordetella pertussis NOT DETECTED NOT DETECTED Final   Chlamydophila pneumoniae NOT DETECTED NOT DETECTED Final   Mycoplasma pneumoniae NOT DETECTED NOT DETECTED Final    Comment: Performed at [facility], [place], [place], [place] [address]  SARS CORONAVIRUS 2 (TAT 6-24 HRS) Nasopharyngeal Nasopharyngeal Swab     Status: None   Collection Time: 03/06/19  5:00 AM   Specimen: Nasopharyngeal Swab  Result Value Ref Range Status   SARS Coronavirus 2 NEGATIVE NEGATIVE Final    Comment: (NOTE) SARS-CoV-2 target nucleic acids are NOT DETECTED. The SARS-CoV-2 RNA is generally detectable in upper and lower respiratory specimens during the acute phase of infection. Negative results do not preclude SARS-CoV-2 infection, do not rule out co-infections with other pathogens, and should not be used as the sole basis for treatment or other patient management decisions. Negative results must be combined with clinical observations, patient history, and epidemiological information. The expected result is Negative. Fact Sheet for Patients: SugarRoll.be Fact Sheet for Healthcare Providers: https://www.woods-mathews.com/ This test is not yet approved or cleared by the [place] FDA and  has been authorized for detection and/or diagnosis of SARS-CoV-2 by FDA under an Emergency Use Authorization (EUA). This EUA will remain  in effect (meaning this test can be used) for the duration of the COVID-19 declaration under Section 56 4(b)(1) of the Act, 21 U.S.C. section 360bbb-3(b)(1), unless the authorization is terminated or revoked sooner. Performed at [facility], [place] [address] [place], [place] [address]   MRSA PCR Screening      Status: None   Collection Time: 03/06/19  7:32 AM   Specimen: Nasopharyngeal  Result Value Ref Range Status   MRSA by PCR NEGATIVE NEGATIVE Final    Comment:        The GeneXpert MRSA Assay (FDA approved for NASAL specimens only), is  one component of a comprehensive MRSA colonization surveillance program. It is not intended to diagnose MRSA infection nor to guide or monitor treatment for MRSA infections. Performed at [facility], [place] [address] [place], [place] [address]          Radiology Studies: No results found.      Scheduled Meds: . chlorhexidine  15 mL Mouth/Throat BID  . Chlorhexidine Gluconate Cloth  6 each Topical Daily  . enalaprilat  0.625 mg Intravenous Once  . enoxaparin (LOVENOX) injection  40 mg Subcutaneous Q24H  . insulin aspart  0-9 Units Subcutaneous TID WC  . levETIRAcetam  1,000 mg Per Tube BID  . magnesium oxide  400 mg Per Tube Daily  . methylPREDNISolone (SOLU-MEDROL) injection  0.5 mg/kg Intravenous Q12H  . metoCLOPramide  5 mg Per Tube QID  . pantoprazole sodium  40 mg Per Tube Daily  . sodium chloride flush  3 mL Intravenous Q12H  . sodium chloride flush  3 mL Intravenous Q12H  . valproic acid  1,000 mg Per Tube TID   Continuous Infusions: . ceFEPime (MAXIPIME) IV 2 g (03/08/19 0404)  . feeding supplement (VITAL HIGH PROTEIN) 55 mL/hr (03/08/19 0452)          Aline August, MD [facility] 03/08/2019, 7:18 AM

## 2019-03-08 NOTE — TOC Transition Note (Signed)
Transition of Care [facility]) - CM/SW Discharge Note   Patient Details  Name: Roy Crawford MRN: [id number] Date of Birth: May 05, 1956  Transition of Care [facility]) CM/SW Contact:  Geralynn Ochs, LCSW Phone Number: 03/08/2019, 10:53 AM   Clinical Narrative:   CSW spoke with patient's nephew and legal guardian, Rommie, to update him on patient's discharge from the hospital. Rommie confirmed plan to return to [facility], where patient has resided for some time. [facility] can take today.   Nurse to call report to [phone], Room 304.    Final next level of care: Skilled Nursing Facility Barriers to Discharge: Barriers Resolved   Patient Goals and CMS Choice Patient states their goals for this hospitalization and ongoing recovery are:: patient unable to state goals due to confusion CMS Medicare.gov Compare Post Acute Care list provided to:: Legal Guardian Choice offered to / list presented to : [place] / [place]  Discharge Placement              Patient chooses bed at: [facility] Patient to be transferred to facility by: [facility] Name of family member notified: Rommie Patient and family notified of of transfer: 03/08/19  Discharge Plan and Services                                     Social Determinants of Health (SDOH) Interventions     Readmission Risk Interventions No flowsheet data found.

## 2019-03-08 NOTE — Discharge Summary (Signed)
Physician Discharge Summary  Roy Crawford [id number] DOB: March 01, 1956 DOA: 03/05/2019  PCP: [name], MD  Admit date: 03/05/2019 Discharge date: 03/08/2019  [facility]  Recommendations for Outpatient Follow-up:  1. Follow up with [facility] provider at earliest convenience  2. Recommend outpatient evaluation and follow-up by palliative care  3. follow up in ED if symptoms worsen or new appear   Home Health: No Equipment/Devices: Supplemental oxygen via tracheostomy  Discharge Condition: Guarded to poor CODE STATUS: DNR Diet recommendation: Tube feeding diet  Brief/Interim Summary: 63 year old male with history of cerebral palsy with intellectual disability, epilepsy, chronic contractures, glaucoma, hyperlipidemia and trach/PEG dependence presented on 03/05/2019 with refractory pneumonia from [facility].  He was sent over because of low blood pressure 80/40.  Patient had increased secretions and was diagnosed with pneumonia 2 days ago prior to presentation and was treated with vancomycin and Rocephin.  He continued to have fevers with increasing hypoxia and was sent to ED for evaluation.  He required 10 L oxygen via trach on presentation.  X-ray showed right lower lobe pneumonia.  He was started on broad-spectrum antibiotics.  COVID-19 testing was negative.  Blood cultures grew Pseudomonas.  Antibiotics were switched to cefepime alone.  He is still requiring supplemental oxygen via trach.  He will be discharged back to [facility] to complete 2 weeks of IV cefepime therapy.  Palliative care evaluation pending.  This can be done as an outpatient.  Discharge Diagnoses:   Acute hypoxic respiratory failure in a patient who is tracheostomy dependent Sepsis: Present on admission Pneumonia: With concern for gram-negative rod pneumonia versus MRSA pneumonia versus aspiration pneumonia Leukocytosis -Patient was started on broad-spectrum antibiotics  cefepime and vancomycin.  Initial Covid testing was negative.  Repeat Covid testing was negative as well.  COVID-19 isolation discontinued. -BCID growing Pseudomonas, subsequently vancomycin discontinued.  Continue cefepime. -Respiratory panel PCR negative. -Spoke to PCCM/Dr. Ander Slade on 03/06/2019 on phone and discussed the case with him.  For now we will continue supplemental oxygen via trach.  If respiratory status gets worse or if there is a problem with tracheostomy, will get PCCM involved officially. -Currently requiring oxygen at 8 L/min via trach  -Discharge to [facility] today to complete 2 weeks of IV cefepime therapy.  Pseudomonas bacteremia -Probably from pneumonia.  Susceptibilities available now.  Antibiotic plan as above.  Continue cefepime till 03/19/2019. -LFTs noted to be only slightly elevated on presentation.  LFTs have not trended up.  Hypokalemia -Improved.  No labs today.  Outpatient follow-up.  cerebral palsy with intellectual disability Epilepsy Chronic contractures Tracheostomy/PEG dependence -Overall prognosis is very poor. -Continue Keppra, valproic acid.  Continue tube feeding. -Palliative care evaluation for goals of care is pending.  This can happen in the outpatient setting.  Hypertension -Initially hypotensive in the ED.  Cardizem, Lopressor and Lasix on hold.  Blood pressure on the high side.  Will resume antihypertensives on discharge.  Diabetes mellitus type 2 -Continue CBGs with SSI.  A1c 5.3   Discharge Instructions   Allergies as of 03/08/2019   Not on File     Medication List    STOP taking these medications   meropenem  IVPB Commonly known as: MERREM     TAKE these medications   acetaminophen 325 MG tablet Commonly known as: TYLENOL Place 650 mg into feeding tube every 6 (six) hours as needed for mild pain.   albuterol (2.5 MG/3ML) 0.083% nebulizer solution Commonly known as: PROVENTIL Take 2.5 mg by  nebulization every 6 (six)  hours as needed for wheezing or shortness of breath.   ceFEPIme 2 g in sodium chloride 0.9 % 100 mL Inject 2 g into the vein every 8 (eight) hours for 11 days. Till 03/19/2019   chlorhexidine 0.12 % solution Commonly known as: PERIDEX Use as directed 15 mLs in the mouth or throat 2 (two) times daily.   CRITIC-AID EX Apply 1 application topically daily. To excoriated skin under penis.   diltiazem 30 MG tablet Commonly known as: CARDIZEM Place 30 mg into feeding tube 3 (three) times daily.   esomeprazole 40 MG capsule Commonly known as: NEXIUM 40 mg daily at 12 noon. Per Tube   furosemide 20 MG tablet Commonly known as: LASIX Place 20 mg into feeding tube daily.   ibuprofen 600 MG tablet Commonly known as: ADVIL Place 600 mg into feeding tube every 6 (six) hours as needed for fever or mild pain.   insulin lispro 100 UNIT/ML injection Commonly known as: HUMALOG Inject 2-10 Units into the skin 3 (three) times daily before meals. Blood sugar > 400 Notify MD 151-200= 2 units 201-250= 4 units 251-300= 6 units 301-350= 8 units 351-400= 10 units   lactulose 10 g packet Commonly known as: CEPHULAC 10 g daily as needed (for constipation). Per tube   levETIRAcetam 1000 MG tablet Commonly known as: KEPPRA Place 1,000 mg into feeding tube 2 (two) times daily.   lidocaine 20 MG/ML injection 60 mg as needed (for cough). nebulization   LORazepam 2 MG/ML injection Commonly known as: ATIVAN Inject 2 mg into the vein every 2 (two) hours as needed for anxiety.   magnesium oxide 400 MG tablet Commonly known as: MAG-OX Place 400 mg into feeding tube daily.   metoCLOPramide 5 MG tablet Commonly known as: REGLAN Place 5 mg into feeding tube 4 (four) times daily.   metoprolol tartrate 25 MG tablet Commonly known as: LOPRESSOR Place 25 mg into feeding tube 2 (two) times daily.   metoprolol tartrate 5 MG/5ML Soln injection Commonly known as: LOPRESSOR Inject 5 mg into the vein  every 6 (six) hours as needed (for heart rate over 120).   nystatin powder Generic drug: nystatin Apply 1 g topically every 12 (twelve) hours as needed. Apply to groin and other fixtures   polyethylene glycol 17 g packet Commonly known as: MIRALAX / GLYCOLAX Place 17 g into feeding tube daily as needed for mild constipation.   Replete Liqd Place 55 mL/hr into feeding tube continuous. 0.06 gram-1 kcal/ml   valproic acid 250 MG/5ML solution Commonly known as: DEPAKENE Place 1,000 mg into feeding tube 3 (three) times daily.       Not on File  Consultations: Spoke to PCCM/Dr. Ander Slade on phone on 03/06/2019 Palliative care evaluation pending   Procedures/Studies: Dg Chest Port 1 View  Result Date: 03/05/2019 CLINICAL DATA:  Shortness of breath EXAM: PORTABLE CHEST 1 VIEW COMPARISON:  January 15, 2019 FINDINGS: There is mild cardiomegaly. Tracheostomy tube seen at the level of clavicular heads. There is mildly increased interstitial markings throughout both lungs. Streaky opacity seen at the right lung base and left upper lung. IMPRESSION: Nonspecific increased interstitial markings and patchy airspace opacity at the right lung base. This could be due to early infectious etiology Electronically Signed   By: Prudencio Pair M.D.   On: 03/05/2019 03:30       Subjective: Patient seen and examined at bedside.  Very poor historian.  Slightly wakes up, does not follow commands.  No overnight vomiting or fever reported by nursing staff  Discharge Exam: Vitals:   03/08/19 0333 03/08/19 0839  BP: (!) 147/100   Pulse: 97 80  Resp: (!) 22 20  Temp: 97.9 F (36.6 C)   SpO2: 98% 98%    General exam: No acute distress.  Looks chronically ill.  Wakes up slightly but does  not follow commands ENT: Tracheostomy in place. Respiratory system: Bilateral decreased breath sounds at bases with scattered crackles.  Intermittent tachypnea  cardiovascular system: Rate controlled, S1-S2  heard gastrointestinal system: Abdomen is nondistended, soft and nontender.  G-tube in place.  Normal bowel sounds heard. Extremities: No cyanosis; trace edema.  Chronic lower extremity skin changes present     The results of significant diagnostics from this hospitalization (including imaging, microbiology, ancillary and laboratory) are listed below for reference.     Microbiology: Recent Results (from the past 240 hour(s))  Blood Culture (routine x 2)     Status: Abnormal (Preliminary result)   Collection Time: 03/05/19  3:32 AM   Specimen: BLOOD LEFT HAND  Result Value Ref Range Status   Specimen Description BLOOD LEFT HAND  Final   Special Requests   Final    BOTTLES DRAWN AEROBIC AND ANAEROBIC Blood Culture adequate volume   Culture  Setup Time   Final    GRAM NEGATIVE RODS AEROBIC BOTTLE ONLY CRITICAL RESULT CALLED TO, READ BACK BY AND VERIFIED WITH: PHRMD J LEDFORD @0343  03/06/19 BY S GEZAHEGN    Culture (A)  Final    PSEUDOMONAS AERUGINOSA CULTURE REINCUBATED FOR BETTER GROWTH Performed at [facility], [place] [address] [place],  [address]    Report Status PENDING  Incomplete   Organism ID, Bacteria PSEUDOMONAS AERUGINOSA  Final      Susceptibility   Pseudomonas aeruginosa - MIC*    CEFTAZIDIME 2 SENSITIVE Sensitive     CIPROFLOXACIN 2 INTERMEDIATE Intermediate     GENTAMICIN >=16 RESISTANT Resistant     IMIPENEM 2 SENSITIVE Sensitive     PIP/TAZO 8 SENSITIVE Sensitive     CEFEPIME 2 SENSITIVE Sensitive     * PSEUDOMONAS AERUGINOSA  Blood Culture ID Panel (Reflexed)     Status: Abnormal   Collection Time: 03/05/19  3:32 AM  Result Value Ref Range Status   Enterococcus species NOT DETECTED NOT DETECTED Final   Listeria monocytogenes NOT DETECTED NOT DETECTED Final   Staphylococcus species NOT DETECTED NOT DETECTED Final   Staphylococcus aureus (BCID) NOT DETECTED NOT DETECTED Final   Streptococcus species NOT DETECTED NOT DETECTED Final    Streptococcus agalactiae NOT DETECTED NOT DETECTED Final   Streptococcus pneumoniae NOT DETECTED NOT DETECTED Final   Streptococcus pyogenes NOT DETECTED NOT DETECTED Final   Acinetobacter baumannii NOT DETECTED NOT DETECTED Final   Enterobacteriaceae species NOT DETECTED NOT DETECTED Final   Enterobacter cloacae complex NOT DETECTED NOT DETECTED Final   Escherichia coli NOT DETECTED NOT DETECTED Final   Klebsiella oxytoca NOT DETECTED NOT DETECTED Final   Klebsiella pneumoniae NOT DETECTED NOT DETECTED Final   Proteus species NOT DETECTED NOT DETECTED Final   Serratia marcescens NOT DETECTED NOT DETECTED Final   Carbapenem resistance NOT DETECTED NOT DETECTED Final   Haemophilus influenzae NOT DETECTED NOT DETECTED Final   Neisseria meningitidis NOT DETECTED NOT DETECTED Final   Pseudomonas aeruginosa DETECTED (A) NOT DETECTED Final    Comment: CRITICAL RESULT CALLED TO, READ BACK BY AND VERIFIED WITH: PHRMD J LEDFORD @0343  03/06/19 BY S GEZAHEGN  Candida albicans NOT DETECTED NOT DETECTED Final   Candida glabrata NOT DETECTED NOT DETECTED Final   Candida krusei NOT DETECTED NOT DETECTED Final   Candida parapsilosis NOT DETECTED NOT DETECTED Final   Candida tropicalis NOT DETECTED NOT DETECTED Final    Comment: Performed at [facility], [place] [address], [place], [place] [address]  SARS CORONAVIRUS 2 (TAT 6-24 HRS) Nasopharyngeal Nasopharyngeal Swab     Status: None   Collection Time: 03/05/19  5:36 AM   Specimen: Nasopharyngeal Swab  Result Value Ref Range Status   SARS Coronavirus 2 NEGATIVE NEGATIVE Final    Comment: (NOTE) SARS-CoV-2 target nucleic acids are NOT DETECTED. The SARS-CoV-2 RNA is generally detectable in upper and lower respiratory specimens during the acute phase of infection. Negative results do not preclude SARS-CoV-2 infection, do not rule out co-infections with other pathogens, and should not be used as the sole basis for treatment or other patient  management decisions. Negative results must be combined with clinical observations, patient history, and epidemiological information. The expected result is Negative. Fact Sheet for Patients: SugarRoll.be Fact Sheet for Healthcare Providers: https://www.woods-mathews.com/ This test is not yet approved or cleared by the [place] FDA and  has been authorized for detection and/or diagnosis of SARS-CoV-2 by FDA under an Emergency Use Authorization (EUA). This EUA will remain  in effect (meaning this test can be used) for the duration of the COVID-19 declaration under Section 56 4(b)(1) of the Act, 21 U.S.C. section 360bbb-3(b)(1), unless the authorization is terminated or revoked sooner. Performed at [facility], [place] [address] [place], [place] [address]   Urine culture     Status: Abnormal (Preliminary result)   Collection Time: 03/05/19  6:15 AM   Specimen: In/Out Cath Urine  Result Value Ref Range Status   Specimen Description IN/OUT CATH URINE  Final   Special Requests NONE  Final   Culture (A)  Final    >=100,000 COLONIES/mL PSEUDOMONAS AERUGINOSA SUSCEPTIBILITIES TO FOLLOW Performed at [facility], [place] [address] [place], [place] [address]    Report Status PENDING  Incomplete  Blood Culture (routine x 2)     Status: None (Preliminary result)   Collection Time: 03/05/19  9:32 AM   Specimen: BLOOD RIGHT HAND  Result Value Ref Range Status   Specimen Description BLOOD RIGHT HAND  Final   Special Requests   Final    BOTTLES DRAWN AEROBIC AND ANAEROBIC Blood Culture adequate volume   Culture   Final    NO GROWTH 3 DAYS Performed at [facility], [place] [address], [place], [place] [address]    Report Status PENDING  Incomplete  Respiratory Panel by PCR     Status: None   Collection Time: 03/05/19  7:30 PM   Specimen: Nasopharyngeal Swab; Respiratory  Result Value Ref Range Status   Adenovirus NOT DETECTED NOT  DETECTED Final   Coronavirus 229E NOT DETECTED NOT DETECTED Final    Comment: (NOTE) The Coronavirus on the Respiratory Panel, DOES NOT test for the novel  Coronavirus (2019 nCoV)    Coronavirus HKU1 NOT DETECTED NOT DETECTED Final   Coronavirus NL63 NOT DETECTED NOT DETECTED Final   Coronavirus OC43 NOT DETECTED NOT DETECTED Final   Metapneumovirus NOT DETECTED NOT DETECTED Final   Rhinovirus / Enterovirus NOT DETECTED NOT DETECTED Final   Influenza A NOT DETECTED NOT DETECTED Final   Influenza B NOT DETECTED NOT DETECTED Final   Parainfluenza Virus 1 NOT DETECTED  NOT DETECTED Final   Parainfluenza Virus 2 NOT DETECTED NOT DETECTED Final   Parainfluenza Virus 3 NOT DETECTED NOT DETECTED Final   Parainfluenza Virus 4 NOT DETECTED NOT DETECTED Final   Respiratory Syncytial Virus NOT DETECTED NOT DETECTED Final   Bordetella pertussis NOT DETECTED NOT DETECTED Final   Chlamydophila pneumoniae NOT DETECTED NOT DETECTED Final   Mycoplasma pneumoniae NOT DETECTED NOT DETECTED Final    Comment: Performed at [facility], [place] [address], [place], [place] [address]  SARS CORONAVIRUS 2 (TAT 6-24 HRS) Nasopharyngeal Nasopharyngeal Swab     Status: None   Collection Time: 03/06/19  5:00 AM   Specimen: Nasopharyngeal Swab  Result Value Ref Range Status   SARS Coronavirus 2 NEGATIVE NEGATIVE Final    Comment: (NOTE) SARS-CoV-2 target nucleic acids are NOT DETECTED. The SARS-CoV-2 RNA is generally detectable in upper and lower respiratory specimens during the acute phase of infection. Negative results do not preclude SARS-CoV-2 infection, do not rule out co-infections with other pathogens, and should not be used as the sole basis for treatment or other patient management decisions. Negative results must be combined with clinical observations, patient history, and epidemiological information. The expected result is Negative. Fact Sheet for  Patients: SugarRoll.be Fact Sheet for Healthcare Providers: https://www.woods-mathews.com/ This test is not yet approved or cleared by the [place] FDA and  has been authorized for detection and/or diagnosis of SARS-CoV-2 by FDA under an Emergency Use Authorization (EUA). This EUA will remain  in effect (meaning this test can be used) for the duration of the COVID-19 declaration under Section 56 4(b)(1) of the Act, 21 U.S.C. section 360bbb-3(b)(1), unless the authorization is terminated or revoked sooner. Performed at [facility], [place] [address] [place], [place] [address]   MRSA PCR Screening     Status: None   Collection Time: 03/06/19  7:32 AM   Specimen: Nasopharyngeal  Result Value Ref Range Status   MRSA by PCR NEGATIVE NEGATIVE Final    Comment:        The GeneXpert MRSA Assay (FDA approved for NASAL specimens only), is one component of a comprehensive MRSA colonization surveillance program. It is not intended to diagnose MRSA infection nor to guide or monitor treatment for MRSA infections. Performed at [facility], [place] [address] [place], [place] [address]      Labs: BNP (last 3 results) Recent Labs    03/05/19 1830  BNP [id number]*   Basic Metabolic Panel: Recent Labs  Lab 03/05/19 0329 03/05/19 0622 03/06/19 0946 03/06/19 1043 03/07/19 0436  NA 133* 138 145  --  148*  K 4.3 3.7 3.1*  --  4.0  CL 93*  --  109  --  114*  CO2 28  --  28  --  28  GLUCOSE 90  --  155*  --  139*  BUN 45*  --  30*  --  33*  CREATININE 0.98  --  0.50*  --  0.46*  CALCIUM 9.2  --  8.8*  --  9.0  MG  --   --   --  1.9 1.9   Liver Function Tests: Recent Labs  Lab 03/05/19 0329 03/07/19 0436  AST 43* 44*  ALT 22 29  ALKPHOS 125 133*  BILITOT 1.0 0.3  PROT 6.7 5.5*  ALBUMIN 2.2* 1.6*   No results for input(s): LIPASE, AMYLASE in the last 168 hours. No results for input(s): AMMONIA in the last 168  hours.  CBC: Recent Labs  Lab 03/05/19 0329 03/05/19 0622 03/06/19 0946 03/07/19 0436  WBC 20.9*  --  12.3* 12.4*  NEUTROABS 16.5*  --   --  11.0*  HGB 11.2* 7.8* 8.6* 8.5*  HCT 35.2* 23.0* 27.6* 27.3*  MCV 89.6  --  90.8 90.7  PLT 330  --  226 251   Cardiac Enzymes: No results for input(s): CKTOTAL, CKMB, CKMBINDEX, TROPONINI in the last 168 hours. BNP: Invalid input(s): POCBNP CBG: Recent Labs  Lab 03/07/19 0732 03/07/19 1136 03/07/19 1632 03/07/19 2131 03/08/19 0841  GLUCAP 138* 179* 121* 131* 126*   D-Dimer Recent Labs    03/05/19 1830  DDIMER 4.84*   Hgb A1c Recent Labs    03/05/19 1833  HGBA1C 5.3   Lipid Profile No results for input(s): CHOL, HDL, LDLCALC, TRIG, CHOLHDL, LDLDIRECT in the last 72 hours. Thyroid function studies No results for input(s): TSH, T4TOTAL, T3FREE, THYROIDAB in the last 72 hours.  Invalid input(s): FREET3 Anemia work up Recent Labs    03/05/19 1830  FERRITIN 318   Urinalysis    Component Value Date/Time   COLORURINE AMBER (A) 03/05/2019 0557   APPEARANCEUR TURBID (A) 03/05/2019 0557   LABSPEC 1.013 03/05/2019 0557   PHURINE 5.0 03/05/2019 0557   GLUCOSEU NEGATIVE 03/05/2019 0557   HGBUR MODERATE (A) 03/05/2019 0557   BILIRUBINUR NEGATIVE 03/05/2019 0557   KETONESUR NEGATIVE 03/05/2019 0557   PROTEINUR 30 (A) 03/05/2019 0557   NITRITE NEGATIVE 03/05/2019 0557   LEUKOCYTESUR LARGE (A) 03/05/2019 0557   Sepsis Labs Invalid input(s): PROCALCITONIN,  WBC,  LACTICIDVEN Microbiology Recent Results (from the past 240 hour(s))  Blood Culture (routine x 2)     Status: Abnormal (Preliminary result)   Collection Time: 03/05/19  3:32 AM   Specimen: BLOOD LEFT HAND  Result Value Ref Range Status   Specimen Description BLOOD LEFT HAND  Final   Special Requests   Final    BOTTLES DRAWN AEROBIC AND ANAEROBIC Blood Culture adequate volume   Culture  Setup Time   Final    GRAM NEGATIVE RODS AEROBIC BOTTLE ONLY CRITICAL  RESULT CALLED TO, READ BACK BY AND VERIFIED WITH: PHRMD J LEDFORD @0343  03/06/19 BY S GEZAHEGN    Culture (A)  Final    PSEUDOMONAS AERUGINOSA CULTURE REINCUBATED FOR BETTER GROWTH Performed at [facility], [place] [address] [place], [place] [address]    Report Status PENDING  Incomplete   Organism ID, Bacteria PSEUDOMONAS AERUGINOSA  Final      Susceptibility   Pseudomonas aeruginosa - MIC*    CEFTAZIDIME 2 SENSITIVE Sensitive     CIPROFLOXACIN 2 INTERMEDIATE Intermediate     GENTAMICIN >=16 RESISTANT Resistant     IMIPENEM 2 SENSITIVE Sensitive     PIP/TAZO 8 SENSITIVE Sensitive     CEFEPIME 2 SENSITIVE Sensitive     * PSEUDOMONAS AERUGINOSA  Blood Culture ID Panel (Reflexed)     Status: Abnormal   Collection Time: 03/05/19  3:32 AM  Result Value Ref Range Status   Enterococcus species NOT DETECTED NOT DETECTED Final   Listeria monocytogenes NOT DETECTED NOT DETECTED Final   Staphylococcus species NOT DETECTED NOT DETECTED Final   Staphylococcus aureus (BCID) NOT DETECTED NOT DETECTED Final   Streptococcus species NOT DETECTED NOT DETECTED Final   Streptococcus agalactiae NOT DETECTED NOT DETECTED Final   Streptococcus pneumoniae NOT DETECTED NOT DETECTED Final   Streptococcus pyogenes NOT DETECTED NOT DETECTED Final   Acinetobacter baumannii NOT DETECTED NOT DETECTED Final  Enterobacteriaceae species NOT DETECTED NOT DETECTED Final   Enterobacter cloacae complex NOT DETECTED NOT DETECTED Final   Escherichia coli NOT DETECTED NOT DETECTED Final   Klebsiella oxytoca NOT DETECTED NOT DETECTED Final   Klebsiella pneumoniae NOT DETECTED NOT DETECTED Final   Proteus species NOT DETECTED NOT DETECTED Final   Serratia marcescens NOT DETECTED NOT DETECTED Final   Carbapenem resistance NOT DETECTED NOT DETECTED Final   Haemophilus influenzae NOT DETECTED NOT DETECTED Final   Neisseria meningitidis NOT DETECTED NOT DETECTED Final   Pseudomonas aeruginosa DETECTED (A) NOT  DETECTED Final    Comment: CRITICAL RESULT CALLED TO, READ BACK BY AND VERIFIED WITH: PHRMD J LEDFORD @0343  03/06/19 BY S GEZAHEGN    Candida albicans NOT DETECTED NOT DETECTED Final   Candida glabrata NOT DETECTED NOT DETECTED Final   Candida krusei NOT DETECTED NOT DETECTED Final   Candida parapsilosis NOT DETECTED NOT DETECTED Final   Candida tropicalis NOT DETECTED NOT DETECTED Final    Comment: Performed at [place] [address] [place], [place] [address]  SARS CORONAVIRUS 2 (TAT 6-24 HRS) Nasopharyngeal Nasopharyngeal Swab     Status: None   Collection Time: 03/05/19  5:36 AM   Specimen: Nasopharyngeal Swab  Result Value Ref Range Status   SARS Coronavirus 2 NEGATIVE NEGATIVE Final    Comment: (NOTE) SARS-CoV-2 target nucleic acids are NOT DETECTED. The SARS-CoV-2 RNA is generally detectable in upper and lower respiratory specimens during the acute phase of infection. Negative results do not preclude SARS-CoV-2 infection, do not rule out co-infections with other pathogens, and should not be used as the sole basis for treatment or other patient management decisions. Negative results must be combined with clinical observations, patient history, and epidemiological information. The expected result is Negative. Fact Sheet for Patients: SugarRoll.be Fact Sheet for Healthcare Providers: https://www.woods-mathews.com/ This test is not yet approved or cleared by the [place] FDA and  has been authorized for detection and/or diagnosis of SARS-CoV-2 by FDA under an Emergency Use Authorization (EUA). This EUA will remain  in effect (meaning this test can be used) for the duration of the COVID-19 declaration under Section 56 4(b)(1) of the Act, 21 U.S.C. section 360bbb-3(b)(1), unless the authorization is terminated or revoked sooner. Performed at [facility], [place] [address] [place], [place] [address]   Urine culture      Status: Abnormal (Preliminary result)   Collection Time: 03/05/19  6:15 AM   Specimen: In/Out Cath Urine  Result Value Ref Range Status   Specimen Description IN/OUT CATH URINE  Final   Special Requests NONE  Final   Culture (A)  Final    >=100,000 COLONIES/mL PSEUDOMONAS AERUGINOSA SUSCEPTIBILITIES TO FOLLOW Performed at [facility], [place] [address] [place], [place] [address]    Report Status PENDING  Incomplete  Blood Culture (routine x 2)     Status: None (Preliminary result)   Collection Time: 03/05/19  9:32 AM   Specimen: BLOOD RIGHT HAND  Result Value Ref Range Status   Specimen Description BLOOD RIGHT HAND  Final   Special Requests   Final    BOTTLES DRAWN AEROBIC AND ANAEROBIC Blood Culture adequate volume   Culture   Final    NO GROWTH 3 DAYS Performed at [facility], [place] [address], [place], [place] [address]    Report Status PENDING  Incomplete  Respiratory Panel by PCR     Status: None   Collection Time: 03/05/19  7:30 PM   Specimen: Nasopharyngeal Swab; Respiratory  Result Value Ref Range Status   Adenovirus NOT DETECTED NOT DETECTED Final   Coronavirus 229E NOT DETECTED NOT DETECTED Final    Comment: (NOTE) The Coronavirus on the Respiratory Panel, DOES NOT test for the novel  Coronavirus (2019 nCoV)    Coronavirus HKU1 NOT DETECTED NOT DETECTED Final   Coronavirus NL63 NOT DETECTED NOT DETECTED Final   Coronavirus OC43 NOT DETECTED NOT DETECTED Final   Metapneumovirus NOT DETECTED NOT DETECTED Final   Rhinovirus / Enterovirus NOT DETECTED NOT DETECTED Final   Influenza A NOT DETECTED NOT DETECTED Final   Influenza B NOT DETECTED NOT DETECTED Final   Parainfluenza Virus 1 NOT DETECTED NOT DETECTED Final   Parainfluenza Virus 2 NOT DETECTED NOT DETECTED Final   Parainfluenza Virus 3 NOT DETECTED NOT DETECTED Final   Parainfluenza Virus 4 NOT DETECTED NOT DETECTED Final   Respiratory Syncytial Virus NOT DETECTED NOT DETECTED Final    Bordetella pertussis NOT DETECTED NOT DETECTED Final   Chlamydophila pneumoniae NOT DETECTED NOT DETECTED Final   Mycoplasma pneumoniae NOT DETECTED NOT DETECTED Final    Comment: Performed at [facility], [place] [address], [place], [place] [address]  SARS CORONAVIRUS 2 (TAT 6-24 HRS) Nasopharyngeal Nasopharyngeal Swab     Status: None   Collection Time: 03/06/19  5:00 AM   Specimen: Nasopharyngeal Swab  Result Value Ref Range Status   SARS Coronavirus 2 NEGATIVE NEGATIVE Final    Comment: (NOTE) SARS-CoV-2 target nucleic acids are NOT DETECTED. The SARS-CoV-2 RNA is generally detectable in upper and lower respiratory specimens during the acute phase of infection. Negative results do not preclude SARS-CoV-2 infection, do not rule out co-infections with other pathogens, and should not be used as the sole basis for treatment or other patient management decisions. Negative results must be combined with clinical observations, patient history, and epidemiological information. The expected result is Negative. Fact Sheet for Patients: SugarRoll.be Fact Sheet for Healthcare Providers: https://www.woods-mathews.com/ This test is not yet approved or cleared by the [place] FDA and  has been authorized for detection and/or diagnosis of SARS-CoV-2 by FDA under an Emergency Use Authorization (EUA). This EUA will remain  in effect (meaning this test can be used) for the duration of the COVID-19 declaration under Section 56 4(b)(1) of the Act, 21 U.S.C. section 360bbb-3(b)(1), unless the authorization is terminated or revoked sooner. Performed at [facility], [place] [address], [place], [place] [address]   MRSA PCR Screening     Status: None   Collection Time: 03/06/19  7:32 AM   Specimen: Nasopharyngeal  Result Value Ref Range Status   MRSA by PCR NEGATIVE NEGATIVE Final    Comment:        The GeneXpert MRSA Assay (FDA approved for  NASAL specimens only), is one component of a comprehensive MRSA colonization surveillance program. It is not intended to diagnose MRSA infection nor to guide or monitor treatment for MRSA infections. Performed at [facility], [place] [address] [place],  [address]      Time coordinating discharge: 35 minutes  SIGNED:   Aline August, MD  [facility] 03/08/2019, 10:32 AM

## 2019-03-09 LAB — CULTURE, BLOOD (ROUTINE X 2): Special Requests: ADEQUATE

## 2019-03-10 LAB — CULTURE, BLOOD (ROUTINE X 2)
Culture: NO GROWTH
Special Requests: ADEQUATE

## 2019-06-09 ENCOUNTER — Emergency Department (HOSPITAL_COMMUNITY): Payer: Medicare Other

## 2019-06-09 ENCOUNTER — Other Ambulatory Visit: Payer: Self-pay

## 2019-06-09 ENCOUNTER — Inpatient Hospital Stay (HOSPITAL_COMMUNITY)
Admission: EM | Admit: 2019-06-09 | Discharge: 2019-06-16 | DRG: 698 | Disposition: A | Discharge status: Hospice | Payer: Medicare Other | Attending: Internal Medicine | Admitting: Internal Medicine

## 2019-06-09 ENCOUNTER — Encounter (HOSPITAL_COMMUNITY): Payer: Self-pay | Admitting: *Deleted

## 2019-06-09 DIAGNOSIS — F79 Unspecified intellectual disabilities: Secondary | ICD-10-CM | POA: Diagnosis present

## 2019-06-09 DIAGNOSIS — E876 Hypokalemia: Secondary | ICD-10-CM | POA: Diagnosis present

## 2019-06-09 DIAGNOSIS — E785 Hyperlipidemia, unspecified: Secondary | ICD-10-CM | POA: Diagnosis present

## 2019-06-09 DIAGNOSIS — N390 Urinary tract infection, site not specified: Secondary | ICD-10-CM | POA: Diagnosis present

## 2019-06-09 DIAGNOSIS — Z20822 Contact with and (suspected) exposure to covid-19: Secondary | ICD-10-CM | POA: Diagnosis present

## 2019-06-09 DIAGNOSIS — Z515 Encounter for palliative care: Secondary | ICD-10-CM | POA: Diagnosis present

## 2019-06-09 DIAGNOSIS — D473 Essential (hemorrhagic) thrombocythemia: Secondary | ICD-10-CM

## 2019-06-09 DIAGNOSIS — Y846 Urinary catheterization as the cause of abnormal reaction of the patient, or of later complication, without mention of misadventure at the time of the procedure: Secondary | ICD-10-CM | POA: Diagnosis present

## 2019-06-09 DIAGNOSIS — Z79899 Other long term (current) drug therapy: Secondary | ICD-10-CM

## 2019-06-09 DIAGNOSIS — Z930 Tracheostomy status: Secondary | ICD-10-CM | POA: Diagnosis not present

## 2019-06-09 DIAGNOSIS — L89314 Pressure ulcer of right buttock, stage 4: Secondary | ICD-10-CM | POA: Diagnosis present

## 2019-06-09 DIAGNOSIS — K9422 Gastrostomy infection: Secondary | ICD-10-CM | POA: Diagnosis present

## 2019-06-09 DIAGNOSIS — T83511A Infection and inflammatory reaction due to indwelling urethral catheter, initial encounter: Secondary | ICD-10-CM | POA: Diagnosis present

## 2019-06-09 DIAGNOSIS — Z794 Long term (current) use of insulin: Secondary | ICD-10-CM

## 2019-06-09 DIAGNOSIS — D696 Thrombocytopenia, unspecified: Secondary | ICD-10-CM | POA: Diagnosis present

## 2019-06-09 DIAGNOSIS — Z6841 Body Mass Index (BMI) 40.0 and over, adult: Secondary | ICD-10-CM

## 2019-06-09 DIAGNOSIS — R748 Abnormal levels of other serum enzymes: Secondary | ICD-10-CM | POA: Diagnosis present

## 2019-06-09 DIAGNOSIS — D649 Anemia, unspecified: Secondary | ICD-10-CM | POA: Diagnosis present

## 2019-06-09 DIAGNOSIS — G40909 Epilepsy, unspecified, not intractable, without status epilepticus: Secondary | ICD-10-CM | POA: Diagnosis present

## 2019-06-09 DIAGNOSIS — Z1624 Resistance to multiple antibiotics: Secondary | ICD-10-CM | POA: Diagnosis present

## 2019-06-09 DIAGNOSIS — L03311 Cellulitis of abdominal wall: Secondary | ICD-10-CM | POA: Diagnosis present

## 2019-06-09 DIAGNOSIS — E870 Hyperosmolality and hypernatremia: Secondary | ICD-10-CM | POA: Diagnosis present

## 2019-06-09 DIAGNOSIS — Z66 Do not resuscitate: Secondary | ICD-10-CM | POA: Diagnosis present

## 2019-06-09 DIAGNOSIS — R7401 Elevation of levels of liver transaminase levels: Secondary | ICD-10-CM | POA: Diagnosis present

## 2019-06-09 DIAGNOSIS — A4159 Other Gram-negative sepsis: Secondary | ICD-10-CM | POA: Diagnosis present

## 2019-06-09 DIAGNOSIS — I472 Ventricular tachycardia: Secondary | ICD-10-CM | POA: Diagnosis not present

## 2019-06-09 DIAGNOSIS — E119 Type 2 diabetes mellitus without complications: Secondary | ICD-10-CM | POA: Diagnosis present

## 2019-06-09 DIAGNOSIS — J9610 Chronic respiratory failure, unspecified whether with hypoxia or hypercapnia: Secondary | ICD-10-CM | POA: Diagnosis present

## 2019-06-09 DIAGNOSIS — I10 Essential (primary) hypertension: Secondary | ICD-10-CM | POA: Diagnosis not present

## 2019-06-09 DIAGNOSIS — R6510 Systemic inflammatory response syndrome (SIRS) of non-infectious origin without acute organ dysfunction: Secondary | ICD-10-CM

## 2019-06-09 DIAGNOSIS — Z7401 Bed confinement status: Secondary | ICD-10-CM

## 2019-06-09 DIAGNOSIS — G808 Other cerebral palsy: Secondary | ICD-10-CM | POA: Diagnosis present

## 2019-06-09 DIAGNOSIS — A4152 Sepsis due to Pseudomonas: Secondary | ICD-10-CM | POA: Diagnosis present

## 2019-06-09 DIAGNOSIS — Z7189 Other specified counseling: Secondary | ICD-10-CM | POA: Diagnosis not present

## 2019-06-09 DIAGNOSIS — L89319 Pressure ulcer of right buttock, unspecified stage: Secondary | ICD-10-CM

## 2019-06-09 DIAGNOSIS — L8944 Pressure ulcer of contiguous site of back, buttock and hip, stage 4: Secondary | ICD-10-CM

## 2019-06-09 DIAGNOSIS — B965 Pseudomonas (aeruginosa) (mallei) (pseudomallei) as the cause of diseases classified elsewhere: Secondary | ICD-10-CM | POA: Diagnosis present

## 2019-06-09 DIAGNOSIS — Z8701 Personal history of pneumonia (recurrent): Secondary | ICD-10-CM

## 2019-06-09 DIAGNOSIS — A419 Sepsis, unspecified organism: Secondary | ICD-10-CM

## 2019-06-09 DIAGNOSIS — H409 Unspecified glaucoma: Secondary | ICD-10-CM | POA: Diagnosis present

## 2019-06-09 DIAGNOSIS — Z22322 Carrier or suspected carrier of Methicillin resistant Staphylococcus aureus: Secondary | ICD-10-CM

## 2019-06-09 DIAGNOSIS — D75839 Thrombocytosis, unspecified: Secondary | ICD-10-CM

## 2019-06-09 DIAGNOSIS — Z931 Gastrostomy status: Secondary | ICD-10-CM | POA: Diagnosis not present

## 2019-06-09 DIAGNOSIS — L89891 Pressure ulcer of other site, stage 1: Secondary | ICD-10-CM | POA: Diagnosis present

## 2019-06-09 DIAGNOSIS — K7200 Acute and subacute hepatic failure without coma: Secondary | ICD-10-CM

## 2019-06-09 LAB — BASIC METABOLIC PANEL
Anion gap: 13 (ref 5–15)
BUN: 42 mg/dL — ABNORMAL HIGH (ref 8–23)
CO2: 36 mmol/L — ABNORMAL HIGH (ref 22–32)
Calcium: 9.3 mg/dL (ref 8.9–10.3)
Chloride: 106 mmol/L (ref 98–111)
Creatinine, Ser: 0.75 mg/dL (ref 0.61–1.24)
GFR calc Af Amer: >60 mL/min (ref >60)
GFR calc non Af Amer: >60 mL/min (ref >60)
Glucose, Bld: 101 mg/dL — ABNORMAL HIGH (ref 70–99)
Potassium: 3.6 mmol/L (ref 3.5–5.1)
Sodium: 155 mmol/L — ABNORMAL HIGH (ref 135–145)

## 2019-06-09 LAB — URINALYSIS, ROUTINE W REFLEX MICROSCOPIC
Bilirubin Urine: NEGATIVE
Glucose, UA: NEGATIVE mg/dL
Ketones, ur: NEGATIVE mg/dL
Nitrite: POSITIVE — AB
Protein, ur: NEGATIVE mg/dL
Specific Gravity, Urine: 1.014 (ref 1.005–1.030)
pH: 7 (ref 5.0–8.0)

## 2019-06-09 LAB — LACTIC ACID, PLASMA
Lactic Acid, Venous: 4.6 mmol/L (ref 0.5–1.9)
Lactic Acid, Venous: 6.6 mmol/L (ref 0.5–1.9)

## 2019-06-09 LAB — CBC WITH DIFFERENTIAL/PLATELET
Abs Immature Granulocytes: 0 10*3/uL (ref 0.00–0.07)
Basophils Absolute: 0.1 10*3/uL (ref 0.0–0.1)
Basophils Relative: 1 %
Eosinophils Absolute: 0 10*3/uL (ref 0.0–0.5)
Eosinophils Relative: 0 %
HCT: 24.8 % — ABNORMAL LOW (ref 39.0–52.0)
Hemoglobin: 7.4 g/dL — ABNORMAL LOW (ref 13.0–17.0)
Lymphocytes Relative: 12 %
Lymphs Abs: 1.4 10*3/uL (ref 0.7–4.0)
MCH: 25.4 pg — ABNORMAL LOW (ref 26.0–34.0)
MCHC: 29.8 g/dL — ABNORMAL LOW (ref 30.0–36.0)
MCV: 85.2 fL (ref 80.0–100.0)
Monocytes Absolute: 1.4 10*3/uL — ABNORMAL HIGH (ref 0.1–1.0)
Monocytes Relative: 12 %
Neutro Abs: 8.8 10*3/uL — ABNORMAL HIGH (ref 1.7–7.7)
Neutrophils Relative %: 75 %
Platelets: 411 10*3/uL — ABNORMAL HIGH (ref 150–400)
RBC: 2.91 MIL/uL — ABNORMAL LOW (ref 4.22–5.81)
RDW: 20 % — ABNORMAL HIGH (ref 11.5–15.5)
WBC: 11.7 10*3/uL — ABNORMAL HIGH (ref 4.0–10.5)
nRBC: 1.8 % — ABNORMAL HIGH (ref 0.0–0.2)
nRBC: 2 /100 WBC — ABNORMAL HIGH

## 2019-06-09 LAB — COMPREHENSIVE METABOLIC PANEL
ALT: 52 U/L — ABNORMAL HIGH (ref 0–44)
AST: 114 U/L — ABNORMAL HIGH (ref 15–41)
Albumin: 1.2 g/dL — ABNORMAL LOW (ref 3.5–5.0)
Alkaline Phosphatase: 1195 U/L — ABNORMAL HIGH (ref 38–126)
Anion gap: 12 (ref 5–15)
BUN: 47 mg/dL — ABNORMAL HIGH (ref 8–23)
CO2: 38 mmol/L — ABNORMAL HIGH (ref 22–32)
Calcium: 9.7 mg/dL (ref 8.9–10.3)
Chloride: 105 mmol/L (ref 98–111)
Creatinine, Ser: 0.8 mg/dL (ref 0.61–1.24)
GFR calc Af Amer: >60 mL/min (ref >60)
GFR calc non Af Amer: >60 mL/min (ref >60)
Glucose, Bld: 127 mg/dL — ABNORMAL HIGH (ref 70–99)
Potassium: 3.8 mmol/L (ref 3.5–5.1)
Sodium: 155 mmol/L — ABNORMAL HIGH (ref 135–145)
Total Bilirubin: 0.5 mg/dL (ref 0.3–1.2)
Total Protein: 6.6 g/dL (ref 6.5–8.1)

## 2019-06-09 LAB — PROTIME-INR
INR: 1.2 (ref 0.8–1.2)
Prothrombin Time: 14.9 seconds (ref 11.4–15.2)

## 2019-06-09 LAB — VANCOMYCIN, RANDOM: Vancomycin Rm: 15

## 2019-06-09 LAB — APTT: aPTT: 36 seconds (ref 24–36)

## 2019-06-09 LAB — POC SARS CORONAVIRUS 2 AG -  ED: SARS Coronavirus 2 Ag: NEGATIVE

## 2019-06-09 LAB — GAMMA GT: GGT: 300 U/L — ABNORMAL HIGH (ref 7–50)

## 2019-06-09 LAB — HIV ANTIBODY (ROUTINE TESTING W REFLEX): HIV Screen 4th Generation wRfx: NONREACTIVE

## 2019-06-09 MED ORDER — ENOXAPARIN SODIUM 40 MG/0.4ML ~~LOC~~ SOLN
40.0000 mg | SUBCUTANEOUS | Status: DC
  Administered 2019-06-09 – 2019-06-13 (×4): 40 mg via SUBCUTANEOUS
  Filled 2019-06-09 (×4): qty 0.4

## 2019-06-09 MED ORDER — SODIUM CHLORIDE 0.9 % IV SOLN
2.0000 g | Freq: Three times a day (TID) | INTRAVENOUS | Status: DC
  Administered 2019-06-10 – 2019-06-14 (×12): 2 g via INTRAVENOUS
  Filled 2019-06-09 (×16): qty 2

## 2019-06-09 MED ORDER — FLUCONAZOLE 200 MG PO TABS
200.0000 mg | ORAL_TABLET | Freq: Every day | ORAL | Status: DC
  Administered 2019-06-09 – 2019-06-13 (×5): 200 mg via ORAL
  Filled 2019-06-09 (×8): qty 1

## 2019-06-09 MED ORDER — LACTATED RINGERS IV BOLUS (SEPSIS)
1000.0000 mL | Freq: Once | INTRAVENOUS | Status: AC
  Administered 2019-06-09: 1000 mL via INTRAVENOUS

## 2019-06-09 MED ORDER — VANCOMYCIN HCL 1250 MG/250ML IV SOLN
1250.0000 mg | INTRAVENOUS | Status: DC
  Administered 2019-06-09 – 2019-06-10 (×2): 1250 mg via INTRAVENOUS
  Filled 2019-06-09 (×3): qty 250

## 2019-06-09 MED ORDER — SODIUM CHLORIDE 0.9 % IV SOLN
2.0000 g | Freq: Once | INTRAVENOUS | Status: AC
  Administered 2019-06-09: 2 g via INTRAVENOUS
  Filled 2019-06-09: qty 2

## 2019-06-09 MED ORDER — LACTATED RINGERS IV BOLUS (SEPSIS)
1000.0000 mL | Freq: Once | INTRAVENOUS | Status: AC
  Administered 2019-06-09: 21:00:00 1000 mL via INTRAVENOUS

## 2019-06-09 MED ORDER — ACETAMINOPHEN 325 MG RE SUPP
325.0000 mg | Freq: Once | RECTAL | Status: AC
  Administered 2019-06-09: 19:00:00 325 mg via RECTAL
  Filled 2019-06-09: qty 1

## 2019-06-09 NOTE — ED Notes (Signed)
PA aware of elevated lactic acid.

## 2019-06-09 NOTE — ED Notes (Signed)
Pt repositioned

## 2019-06-09 NOTE — ED Provider Notes (Addendum)
[facility] EMERGENCY DEPARTMENT Provider Note   CSN: [id number] Arrival date & time: 06/09/19  1250     History Chief Complaint  Patient presents with  . Wound Check    Roy Crawford is a 64 y.o. male.  HPI Level 5 caveat due to nonverbal status  44ear-old male with history of cerebral palsy with intellectual disability, epilepsy, chronic contractures, glaucoma, hyperlipidemia and trach/PEG dependence  Patient is here from [facility] for wound check.  Patient has sacral pressure ulcer that facility felt may need a wound VAC.  Patient is on 8 L of oxygen at baseline and has a trach in place.  Patient is on vancomycin via PICC line.  Patient was last hospitalized 03/08/2023 pneumonia.  Blood cultures grew out Pseudomonas and patient was transitioned from vancomycin to cefepime--on discharge she was sent to [facility] for 2 weeks of IV cefepime and then continued presumably on vancomycin after that since patient presents today on vancomycin.     Past Medical History:  Diagnosis Date  . 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 Dyslipidemia   . Epilepsy ([place])   . Glaucoma   . Intellectual disability   . Tracheostomy dependent [facility])     Patient Active Problem List   Diagnosis Date Noted  . Acute respiratory failure with hypoxia ([place]) 03/05/2019  . RLL pneumonia 03/05/2019  . Sepsis due to pneumonia ([place]) 03/05/2019  . Tracheostomy dependent ([place])   . Intellectual disability   . Glaucoma   . Epilepsy ([place])   . Dyslipidemia   . Cerebral palsy, quadriplegic ([facility])     No past surgical history on file.     No family history on file.  Social History   Tobacco Use  . Smoking status: Never Smoker  . Smokeless tobacco: Never Used  Substance Use Topics  . Alcohol use: Never  . Drug use: Never    Home Medications Prior to Admission medications   Medication Sig Start Date End Date Taking? Authorizing Provider  acetaminophen (TYLENOL) 325 MG  tablet Place 650 mg into feeding tube every 6 (six) hours as needed for mild pain.    [provider]  albuterol (PROVENTIL) (2.5 MG/3ML) 0.083% nebulizer solution Take 2.5 mg by nebulization every 6 (six) hours as needed for wheezing or shortness of breath.     [provider]  chlorhexidine (PERIDEX) 0.12 % solution Use as directed 15 mLs in the mouth or throat 2 (two) times daily.    [provider]  diltiazem (CARDIZEM) 30 MG tablet Place 30 mg into feeding tube 3 (three) times daily.    [provider]  esomeprazole (NEXIUM) 40 MG capsule 40 mg daily at 12 noon. Per Tube    [provider]  furosemide (LASIX) 20 MG tablet Place 20 mg into feeding tube daily.    [provider]  ibuprofen (ADVIL) 600 MG tablet Place 600 mg into feeding tube every 6 (six) hours as needed for fever or mild pain.    [provider]  insulin lispro (HUMALOG) 100 UNIT/ML injection Inject 2-10 Units into the skin 3 (three) times daily before meals. Blood sugar > 400 Notify MD 151-200= 2 units 201-250= 4 units 251-300= 6 units 301-350= 8 units 351-400= 10 units    [provider]  lactulose (CEPHULAC) 10 g packet 10 g daily as needed (for constipation). Per tube    [provider]  levETIRAcetam (KEPPRA) 1000 MG tablet Place 1,000 mg into feeding tube  2 (two) times daily.    [provider]  lidocaine 20 MG/ML injection 60 mg as needed (for cough). nebulization    [provider]  LORazepam (ATIVAN) 2 MG/ML injection Inject 2 mg into the vein every 2 (two) hours as needed for anxiety.    [provider]  magnesium oxide (MAG-OX) 400 MG tablet Place 400 mg into feeding tube daily.    [provider]  metoCLOPramide (REGLAN) 5 MG tablet Place 5 mg into feeding tube 4 (four) times daily.    [provider]  metoprolol tartrate (LOPRESSOR) 25 MG tablet Place 25 mg into feeding tube 2 (two) times  daily.     [provider]  metoprolol tartrate (LOPRESSOR) 5 MG/5ML SOLN injection Inject 5 mg into the vein every 6 (six) hours as needed (for heart rate over 120).    [provider]  Nutritional Supplements (REPLETE) LIQD Place 55 mL/hr into feeding tube continuous. 0.06 gram-1 kcal/ml    [provider]  nystatin (NYSTATIN) powder Apply 1 g topically every 12 (twelve) hours as needed. Apply to groin and other fixtures    [provider]  polyethylene glycol (MIRALAX / GLYCOLAX) 17 g packet Place 17 g into feeding tube daily as needed for mild constipation.    [provider]  Skin Protectants, Misc. (CRITIC-AID EX) Apply 1 application topically daily. To excoriated skin under penis.    [provider]  valproic acid (DEPAKENE) 250 MG/5ML SOLN solution Place 1,000 mg into feeding tube 3 (three) times daily.    [provider]    Allergies    Patient has no allergy information on record.  Review of Systems   Review of Systems  Unable to perform ROS: Patient nonverbal    Physical Exam Updated Vital Signs BP 119/68 (BP Location: Left Arm)   Pulse 82   Temp (!) 100.4 F (38 C) (Rectal)   Resp (!) 33   SpO2 98%   Physical Exam Vitals and nursing note reviewed.  Constitutional:      General: He is not in acute distress.    Appearance: He is not ill-appearing.     Comments: Patient is nonverbal non-responsive to stimuli.  This is baseline for him. 64 year old male appears older than stated age.  Is inert in bed  HENT:     Head: Normocephalic and atraumatic.     Nose: Nose normal.     Mouth/Throat:     Mouth: Mucous membranes are moist.  Eyes:     General: No scleral icterus. Neck:     Comments: Lurline Idol present Cardiovascular:     Rate and Rhythm: Normal rate and regular rhythm.     Pulses: Normal pulses.     Heart sounds: Normal heart sounds.  Pulmonary:     Effort: Pulmonary effort is normal. No respiratory  distress.     Breath sounds: No wheezing.     Comments: Turbulent air sounds through trach.  No crackles or rhonchi auscultated. Abdominal:     General: There is no distension.     Palpations: Abdomen is soft.     Tenderness: There is no abdominal tenderness. There is no guarding.     Comments: PEG tube in place.  Abdomen is soft nondistended.  No obvious reaction with palpation of abdomen.  Genitourinary:    Comments: Testes are enlarged.  Nonpitting edema present.  Foley in place Musculoskeletal:     Cervical back: Normal range of motion.  Right lower leg: No edema.     Left lower leg: No edema.  Skin:    General: Skin is warm and dry.     Capillary Refill: Capillary refill takes less than 2 seconds.     Comments: See picture below.  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  No purulent discharge.  Ulcer is actively bleeding. No surrounding erythema cellulitis.  Posterior scrotum with small quarter sized stage I pressure ulcer.  No signs of infection.  Neurological:     Mental Status: He is alert. Mental status is at baseline.     Comments: Unable to assess mental status, sensation, motor as patient has developmental delay.  And is nonverbal.  Psychiatric:        Mood and Affect: Mood normal.        Behavior: Behavior normal.     Comments: Unable to assess as patient has developmental delay.  And is nonverbal.        ED Results / Procedures / Treatments   Labs (all labs ordered are listed, but only abnormal results are displayed) Labs Reviewed  CULTURE, BLOOD (ROUTINE X 2)  CULTURE, BLOOD (ROUTINE X 2)  URINE CULTURE  COMPREHENSIVE METABOLIC PANEL  CBC WITH DIFFERENTIAL/PLATELET  URINALYSIS, ROUTINE W REFLEX MICROSCOPIC  LACTIC ACID, PLASMA  LACTIC ACID, PLASMA    EKG EKG Interpretation  Date/Time:  Wednesday June 09 2019 12:59:49 EST Ventricular Rate:  82 PR Interval:    QRS Duration: 85 QT Interval:  443 QTC  Calculation: 518 R Axis:   20 Text Interpretation: Sinus rhythm Abnormal R-wave progression, early transition LVH with secondary repolarization abnormality Prolonged QT interval Confirmed by Davonna Belling [phone]) on 06/09/2019 1:35:19 PM   Radiology DG Chest Port 1 View  Result Date: 06/09/2019 CLINICAL DATA:  Tachypnea.  Sacral pressure ulcer. EXAM: PORTABLE CHEST 1 VIEW COMPARISON:  03/05/2019 FINDINGS: Cardiac silhouette is mildly enlarged. No mediastinal or hilar masses. There are prominent bronchovascular markings. Interstitial and hazy airspace opacities are noted in the left lung centrally. No convincing pleural effusion.  No pneumothorax. Right PICC tip projects in the mid superior vena cava. Tracheostomy tube tip projects in the upper thoracic trachea. Skeletal structures show a marked thoracic dextroscoliosis. IMPRESSION: 1. Prominent bronchovascular markings with left lung interstitial and hazy intervening airspace opacities. Findings on the left may be due to bronchovascular crowding and atelectasis. Consider infection there are consistent clinical findings. No over pulmonary edema. 2. Stable mild cardiomegaly. 3. Dextroscoliosis. Electronically Signed   By: Lajean Manes M.D.   On: 06/09/2019 14:25   DG Hip Unilat W or Wo Pelvis 2-3 Views Right  Result Date: 06/09/2019 CLINICAL DATA:  Stage IV pressure ulcer. Concern for osteomyelitis right ischium. EXAM: DG HIP (WITH OR WITHOUT PELVIS) 2-3V RIGHT COMPARISON:  None. FINDINGS: There is moderate diffuse osteopenia. There is no discernible left femoral head or neck as there is superior subluxation of the adjacent trochanteric portion of the left femur likely chronic deformity. Severe degenerative changes of the right hip with irregular appearance of the femoral head. Right hip joint is not well-defined as there may be partial chronic ankylosis. There is a 6.6 cm lucent defect overlying the right proximal femur likely representing air in the  superimposed soft tissues compatible patient's known soft tissue ulcer. There is no focal lytic process over the right ischium. Chronic infection of the right hip joint is possible. There are degenerative changes of the spine. IMPRESSION: 1. 6.6 cm air  collection within the soft tissues overlying the right hip likely involving the posterior tissues compatible patient's known soft tissue ulceration. No definite bone destruction of the adjacent right ischium. Associated chronic deformity of the right hip joint and femoral head as chronic infection is possible. 2.  Left hip deformity as described suggesting chronic deformity. Electronically Signed   By: Marin Olp M.D.   On: 06/09/2019 16:05    Procedures .Critical Care Performed by: Tedd Sias, PA Authorized by: Tedd Sias, PA   Critical care provider statement:    Critical care time (minutes):  35   Critical care was necessary to treat or prevent imminent or life-threatening deterioration of the following conditions:  Sepsis   Critical care was time spent personally by me on the following activities:  Discussions with consultants, evaluation of patient's response to treatment, examination of patient, ordering and performing treatments and interventions, ordering and review of laboratory studies, ordering and review of radiographic studies, pulse oximetry, re-evaluation of patient's condition, obtaining history from patient or surrogate and review of old charts   (including critical care time)  Medications Ordered in ED Medications  acetaminophen (TYLENOL) suppository 325 mg (has no administration in time range)    ED Course  I have reviewed the triage vital signs and the nursing notes.  Pertinent labs & imaging results that were available during my care of the patient were reviewed by me and considered in my medical decision making (see chart for details).    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  Per nursing staff he was sent for evaluation and consideration of wound VAC.  On presentation to ED he was found to have temp elevated at 100.4.   Physical exam is remarkable for posterior ulcer.  Medication for code sepsis at this time as patient is not hypotensive, pulse rate is normal.  Respiratory rate is mildly elevated however this may be baseline for patient as he has trach in place.  We will not call code sepsis at this time.  Labs are pending. Suspect sepsis with likely PNA or urine as source although osteomyelitis/wound infection also high on differential.  Ordered plain films of hip and chest, EKG, blood cultures, lactate, UA, urine culture, CBC and CMP.  Anticipate admission.  Will wait for labs before empirically broadening antibiotic coverage.  Patient is chronically on vancomycin.  Patient care transferred to [facility] PA-C at 5:15 PM.  She will follow up on labs and appropriately disposition.    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

## 2019-06-09 NOTE — Progress Notes (Signed)
Pharmacy Antibiotic Note  Roy Crawford is a 63 y.o. male admitted on 06/09/2019 with sepsis.  Pharmacy has been consulted for Cefepime and Vancomycin dosing.  Weight: 185 lb 3 oz (84 kg)  Temp (24hrs), Avg:100.1 F (37.8 C), Min:99.8 F (37.7 C), Max:100.4 F (38 C)  Recent Labs  Lab 06/09/19 1709 06/09/19 1710 06/09/19 1832  WBC  --  11.7*  --   CREATININE  --  0.80  --   LATICACIDVEN 4.6*  --   --   VANCORANDOM  --   --  15    CrCl cannot be calculated (Unknown ideal weight.).    Not on File  Antimicrobials this admission: 1/27 Cefepime >>  1/27 Vancomycin >>   Dose adjustments this admission:   Microbiology results: 1/27 BCx: Pending 1/27 UCx: Pending   Plan: - Cefepime 2g IV q8h  - Vancomycin at [facility] difficult to tell how much looked to be 1000 mg q24h ? - Vancomycin trough at this time is 15 ug/ml - Will start Vancomycin 1250mg  IV q24h  - Est Calc AUC 515 - Monitor patients renal function and urine output  Thank you for allowing pharmacy to be a part of this patient's care.  Duanne Limerick PharmD. BCPS  06/09/2019 7:44 PM

## 2019-06-09 NOTE — ED Provider Notes (Signed)
Received patient at signout from [facility]  Refer to provider note for full history and physical examination.  Briefly, patient is a 64 year old male with history of cerebral palsy, intellectual disability, chronically contractured extremities, hyperlipidemia, trach/PEG dependent sent from [facility] for wound check with concern that he may need a wound VAC over his pressure ulcer.  See images attached to initial providers note.  He is currently on IV vancomycin via PICC line due to unknown reason.  He was previously admitted in October for respiratory failure, discontinued on vancomycin and started on cefepime at that time.  Found to be febrile in the ED here, will call code sepsis, give 30 cc/kg bolus and obtain lab work and imaging for further evaluation.  Plan for likely admission.   Physical Exam  BP 119/68 (BP Location: Left Arm)   Pulse 82   Temp (!) 100.4 F (38 C) (Rectal)   Resp (!) 33   SpO2 98%   Physical Exam Vitals and nursing note reviewed.  Constitutional:      General: He is not in acute distress.    Appearance: He is well-developed.  HENT:     Head: Normocephalic and atraumatic.  Eyes:     General:        Right eye: No discharge.        Left eye: No discharge.     Conjunctiva/sclera: Conjunctivae normal.  Neck:     Vascular: No JVD.     Trachea: No tracheal deviation.  Cardiovascular:     Rate and Rhythm: Normal rate.  Pulmonary:     Comments: Tachypneic Abdominal:     General: There is no distension.     Comments: Chronic indwelling Foley catheter  Skin:    General: Skin is warm.     Findings: No erythema.  Neurological:     Mental Status: He is alert.     Comments: Chronically contracted extremities  Psychiatric:        Behavior: Behavior normal.     ED Course/Procedures     .Critical Care Performed by: Renita Papa, PA-C Authorized by: Renita Papa, PA-C   Critical care provider statement:    Critical care time (minutes):  45   Critical care  was necessary to treat or prevent imminent or life-threatening deterioration of the following conditions:  Sepsis   Critical care was time spent personally by me on the following activities:  Discussions with consultants, evaluation of patient's response to treatment, examination of patient, ordering and performing treatments and interventions, ordering and review of laboratory studies, ordering and review of radiographic studies, pulse oximetry, re-evaluation of patient's condition, obtaining history from patient or surrogate and review of old charts    MDM   Labs Reviewed  COMPREHENSIVE METABOLIC PANEL - Abnormal; Notable for the following components:      Result Value   Sodium 155 (*)    CO2 38 (*)    Glucose, Bld 127 (*)    BUN 47 (*)    Albumin 1.2 (*)    AST 114 (*)    ALT 52 (*)    Alkaline Phosphatase 1,195 (*)    All other components within normal limits  CBC WITH DIFFERENTIAL/PLATELET - Abnormal; Notable for the following components:   WBC 11.7 (*)    RBC 2.91 (*)    Hemoglobin 7.4 (*)    HCT 24.8 (*)    MCH 25.4 (*)    MCHC 29.8 (*)    RDW 20.0 (*)  Platelets 411 (*)    nRBC 1.8 (*)    Neutro Abs 8.8 (*)    Monocytes Absolute 1.4 (*)    nRBC 2 (*)    All other components within normal limits  URINALYSIS, ROUTINE W REFLEX MICROSCOPIC - Abnormal; Notable for the following components:   APPearance CLOUDY (*)    Hgb urine dipstick SMALL (*)    Nitrite POSITIVE (*)    Leukocytes,Ua LARGE (*)    Bacteria, UA MANY (*)    All other components within normal limits  LACTIC ACID, PLASMA - Abnormal; Notable for the following components:   Lactic Acid, Venous 4.6 (*)    All other components within normal limits  LACTIC ACID, PLASMA - Abnormal; Notable for the following components:   Lactic Acid, Venous 6.6 (*)    All other components within normal limits  GAMMA GT - Abnormal; Notable for the following components:   GGT 300 (*)    All other components within normal  limits  BASIC METABOLIC PANEL - Abnormal; Notable for the following components:   Sodium 155 (*)    CO2 36 (*)    Glucose, Bld 101 (*)    BUN 42 (*)    All other components within normal limits  AEROBIC CULTURE (SUPERFICIAL SPECIMEN)  CULTURE, BLOOD (ROUTINE X 2)  CULTURE, BLOOD (ROUTINE X 2)  URINE CULTURE  SARS CORONAVIRUS 2 (TAT 6-24 HRS)  NASOPHARYNGEAL CULTURE  PROTIME-INR  APTT  VANCOMYCIN, RANDOM  HIV ANTIBODY (ROUTINE TESTING W REFLEX)  CBC  COMPREHENSIVE METABOLIC PANEL  POC SARS CORONAVIRUS 2 AG -  ED  POC SARS CORONAVIRUS 2 AG -  ED    DG Chest Port 1 View  Result Date: 06/09/2019 CLINICAL DATA:  Tachypnea.  Sacral pressure ulcer. EXAM: PORTABLE CHEST 1 VIEW COMPARISON:  03/05/2019 FINDINGS: Cardiac silhouette is mildly enlarged. No mediastinal or hilar masses. There are prominent bronchovascular markings. Interstitial and hazy airspace opacities are noted in the left lung centrally. No convincing pleural effusion.  No pneumothorax. Right PICC tip projects in the mid superior vena cava. Tracheostomy tube tip projects in the upper thoracic trachea. Skeletal structures show a marked thoracic dextroscoliosis. IMPRESSION: 1. Prominent bronchovascular markings with left lung interstitial and hazy intervening airspace opacities. Findings on the left may be due to bronchovascular crowding and atelectasis. Consider infection there are consistent clinical findings. No over pulmonary edema. 2. Stable mild cardiomegaly. 3. Dextroscoliosis. Electronically Signed   By: Lajean Manes M.D.   On: 06/09/2019 14:25   DG Hip Unilat W or Wo Pelvis 2-3 Views Right  Result Date: 06/09/2019 CLINICAL DATA:  Stage IV pressure ulcer. Concern for osteomyelitis right ischium. EXAM: DG HIP (WITH OR WITHOUT PELVIS) 2-3V RIGHT COMPARISON:  None. FINDINGS: There is moderate diffuse osteopenia. There is no discernible left femoral head or neck as there is superior subluxation of the adjacent trochanteric  portion of the left femur likely chronic deformity. Severe degenerative changes of the right hip with irregular appearance of the femoral head. Right hip joint is not well-defined as there may be partial chronic ankylosis. There is a 6.6 cm lucent defect overlying the right proximal femur likely representing air in the superimposed soft tissues compatible patient's known soft tissue ulcer. There is no focal lytic process over the right ischium. Chronic infection of the right hip joint is possible. There are degenerative changes of the spine. IMPRESSION: 1. 6.6 cm air collection within the soft tissues overlying the right hip likely involving the posterior tissues  compatible patient's known soft tissue ulceration. No definite bone destruction of the adjacent right ischium. Associated chronic deformity of the right hip joint and femoral head as chronic infection is possible. 2.  Left hip deformity as described suggesting chronic deformity. Electronically Signed   By: Marin Olp M.D.   On: 06/09/2019 16:05    Lab work notable for leukocytosis, anemia, acutely elevated LFTs including very high alkaline phosphatase which could be in the setting of bony destruction versus sepsis or other reason.  UA concerning for UTI, will culture.  Chest x-ray shows possible infectious findings.  Numerous possible sources of infection in this patient.  Per paperwork at the bedside provided by patient's facility, the patient has allergies to cefazolin, clavulanate and penicillin however no reactions are listed.  He has tolerated cefepime in the past.  I spoke with Corene Cornea with pharmacy who recommended getting both the cefepime and continuing vancomycin which would likely cover sources from a respiratory, urinary, and bony infection standpoint.  Spoke with Dr. Flossie Buffy with [facility] who agrees to assume care of patient and bring him to the hospital for further evaluation and management.        Renita Papa,  PA-C 06/10/19 [place], [place], DO 06/10/19 2118

## 2019-06-09 NOTE — ED Notes (Signed)
Pt. has been transferred to hospital bed, and pillows were placed under pt. to alleviate pressure from bed ulcer.

## 2019-06-09 NOTE — ED Triage Notes (Addendum)
Pt here via [facility] from [facility] for wound check.  Per [facility], pt has a sacral pressure ulcer that may need a wound vac.  Pt is on 8L , trached.  Flat affect per norm. No other complaints from [facility] staff.  VS 99%, 8L CBG 179  bp 118/66 Hr 80

## 2019-06-09 NOTE — H&P (Addendum)
History and Physical    [name] [id number] DOB: [birth year] DOA: 06/09/2019  PCP: [name], MD  Patient coming from: [facility]  I have personally briefly reviewed patient's old medical records in [place]  Chief Complaint: wound check  HPI: Roy Crawford is a 64 y.o. male with medical history significant for cerebral palsy with Intellectual disability, quadriplegia and tracheostomy dependency, chronic contractures, Epilepsy and recent refractory pseudomonas pneumonia requiring baseline 8L via trach and outpatient IV antibiotics.  He is a resident at [facility]. Reportedly sent over for possible worsening ulcer. Patient is nonverbal at baseline and unable to provide history.   ED Course: He was febrile up to 100.4, tachypneic at baseline 8L, 35% FiO2 through tracheostomy collar.   WBC of 11.7, hemoglobin of 7.4 down from 8.5, platelet of 411 Lactate of 4.6 Na of 155, glucose of 127 AST of 114, ALT of 52, alkaline phosphatase of 1196 UA with positive nitrate and large leukocytes and many bacteria with budding yeast  He was given Tylenol suppository, 3L of LR fluid resuscitation, and started on vancomycin and cefepime.  Review of Systems: Unable to obtain given patient's cerebral palsy and nonverbal at baseline  Past Medical History:  Diagnosis Date  . Cerebral palsy, quadriplegic ([place])   . Dyslipidemia   . Epilepsy ([place])   . Glaucoma   . Intellectual disability   . Tracheostomy dependent ([place])     History reviewed. No pertinent surgical history.   reports that he has never smoked. He has never used smokeless tobacco. He reports that he does not drink alcohol or use drugs.  Not on File  No family history on file.-unable to obtain due to pt's clinical status   Prior to Admission medications   Medication Sig Start Date End Date Taking? Authorizing Provider  acetaminophen (TYLENOL) 325 MG tablet Place 650 mg into feeding tube every 6 (six)  hours as needed for mild pain.    [provider]  albuterol (PROVENTIL) (2.5 MG/3ML) 0.083% nebulizer solution Take 2.5 mg by nebulization every 6 (six) hours as needed for wheezing or shortness of breath.     [provider]  chlorhexidine (PERIDEX) 0.12 % solution Use as directed 15 mLs in the mouth or throat 2 (two) times daily.    [provider]  diltiazem (CARDIZEM) 30 MG tablet Place 30 mg into feeding tube 3 (three) times daily.    [provider]  esomeprazole (NEXIUM) 40 MG capsule 40 mg daily at 12 noon. Per Tube    [provider]  furosemide (LASIX) 20 MG tablet Place 20 mg into feeding tube daily.    [provider]  ibuprofen (ADVIL) 600 MG tablet Place 600 mg into feeding tube every 6 (six) hours as needed for fever or mild pain.    [provider]  insulin lispro (HUMALOG) 100 UNIT/ML injection Inject 2-10 Units into the skin 3 (three) times daily before meals. Blood sugar > 400 Notify MD 151-200= 2 units 201-250= 4 units 251-300= 6 units 301-350= 8 units 351-400= 10 units    [provider]  lactulose (CEPHULAC) 10 g packet 10 g daily as needed (for constipation). Per tube    [provider]  levETIRAcetam (KEPPRA) 1000 MG tablet Place 1,000 mg into feeding tube 2 (two) times daily.    [provider]  lidocaine 20 MG/ML injection 60 mg as needed (for cough). nebulization    [provider]  LORazepam (ATIVAN)  2 MG/ML injection Inject 2 mg into the vein every 2 (two) hours as needed for anxiety.    [provider]  magnesium oxide (MAG-OX) 400 MG tablet Place 400 mg into feeding tube daily.    [provider]  metoCLOPramide (REGLAN) 5 MG tablet Place 5 mg into feeding tube 4 (four) times daily.    [provider]  metoprolol tartrate (LOPRESSOR) 25 MG tablet Place 25 mg into feeding tube 2 (two) times daily.     [provider]  metoprolol  tartrate (LOPRESSOR) 5 MG/5ML SOLN injection Inject 5 mg into the vein every 6 (six) hours as needed (for heart rate over 120).    [provider]  Nutritional Supplements (REPLETE) LIQD Place 55 mL/hr into feeding tube continuous. 0.06 gram-1 kcal/ml    [provider]  nystatin (NYSTATIN) powder Apply 1 g topically every 12 (twelve) hours as needed. Apply to groin and other fixtures    [provider]  polyethylene glycol (MIRALAX / GLYCOLAX) 17 g packet Place 17 g into feeding tube daily as needed for mild constipation.    [provider]  Skin Protectants, Misc. (CRITIC-AID EX) Apply 1 application topically daily. To excoriated skin under penis.    [provider]  valproic acid (DEPAKENE) 250 MG/5ML SOLN solution Place 1,000 mg into feeding tube 3 (three) times daily.    [provider]    Physical Exam: Vitals:   06/09/19 1631 06/09/19 1730 06/09/19 1830 06/09/19 1913  BP:   106/60   Pulse:  91 95 98  Resp:  (!) 26 (!) 27 (!) 21  Temp:      TempSrc:      SpO2: 98% 100% 100% 100%  Weight:        Constitutional: non-toxic appearing male laying on his left side Vitals:   06/09/19 1631 06/09/19 1730 06/09/19 1830 06/09/19 1913  BP:   106/60   Pulse:  91 95 98  Resp:  (!) 26 (!) 27 (!) 21  Temp:      TempSrc:      SpO2: 98% 100% 100% 100%  Weight:       Eyes: PERRL, lids normal with mildly erythematous conjunctivae bilaterally ENMT: Mucous membranes are moist with large amount of secretions. Proximal auricle of bilateral ear with wound, purulent drainage and minimal bleeding.  Neck:trach collar in place Respiratory: ronchus sounds anteriorly, on 8L via trach collar  Cardiovascular: Regular rate and rhythm, no murmurs / rubs / gallops. No extremity edema.  Abdomen: difficult to assess given pt is non-verbal, no masses palpated. No hepatosplenomegaly. Bowel sounds positive. PEG tube in place to right lower quadrant- minimal  purulent drainage noted at wound opening GU: chronic indwelling catheter with 1200cc in bag Musculoskeletal: contracture of all 4 extremities  Skin: thickened skin of bilateral pretibial region Neurologic: pt not able to follow commands. Does not withdrawal extremities to noxious stimuli, non-verbal.  Psychiatric: alert   Labs on Admission: I have personally reviewed following labs and imaging studies  CBC: Recent Labs  Lab 06/09/19 1710  WBC 11.7*  NEUTROABS 8.8*  HGB 7.4*  HCT 24.8*  MCV 85.2  PLT 123456*   Basic Metabolic Panel: Recent Labs  Lab 06/09/19 1710  NA 155*  K 3.8  CL 105  CO2 38*  GLUCOSE 127*  BUN 47*  CREATININE 0.80  CALCIUM 9.7   GFR: CrCl cannot be calculated (Unknown ideal weight.). Liver Function Tests: Recent Labs  Lab 06/09/19 1710  AST 114*  ALT 52*  ALKPHOS 1,195*  BILITOT 0.5  PROT 6.6  ALBUMIN 1.2*   No results for input(s): LIPASE, AMYLASE in the last 168 hours. No results for input(s): AMMONIA in the last 168 hours. Coagulation Profile: Recent Labs  Lab 06/09/19 1810  INR 1.2   Cardiac Enzymes: No results for input(s): CKTOTAL, CKMB, CKMBINDEX, TROPONINI in the last 168 hours. BNP (last 3 results) No results for input(s): PROBNP in the last 8760 hours. HbA1C: No results for input(s): HGBA1C in the last 72 hours. CBG: No results for input(s): GLUCAP in the last 168 hours. Lipid Profile: No results for input(s): CHOL, HDL, LDLCALC, TRIG, CHOLHDL, LDLDIRECT in the last 72 hours. Thyroid Function Tests: No results for input(s): TSH, T4TOTAL, FREET4, T3FREE, THYROIDAB in the last 72 hours. Anemia Panel: No results for input(s): VITAMINB12, FOLATE, FERRITIN, TIBC, IRON, RETICCTPCT in the last 72 hours. Urine analysis:    Component Value Date/Time   COLORURINE YELLOW 06/09/2019 1725   APPEARANCEUR CLOUDY (A) 06/09/2019 1725   LABSPEC 1.014 06/09/2019 1725   PHURINE 7.0 06/09/2019 1725   GLUCOSEU NEGATIVE 06/09/2019 1725    HGBUR SMALL (A) 06/09/2019 1725   BILIRUBINUR NEGATIVE 06/09/2019 [place] 06/09/2019 1725   PROTEINUR NEGATIVE 06/09/2019 1725   NITRITE POSITIVE (A) 06/09/2019 1725   LEUKOCYTESUR LARGE (A) 06/09/2019 1725    Radiological Exams on Admission: DG Chest Port 1 View  Result Date: 06/09/2019 CLINICAL DATA:  Tachypnea.  Sacral pressure ulcer. EXAM: PORTABLE CHEST 1 VIEW COMPARISON:  03/05/2019 FINDINGS: Cardiac silhouette is mildly enlarged. No mediastinal or hilar masses. There are prominent bronchovascular markings. Interstitial and hazy airspace opacities are noted in the left lung centrally. No convincing pleural effusion.  No pneumothorax. Right PICC tip projects in the mid superior vena cava. Tracheostomy tube tip projects in the upper thoracic trachea. Skeletal structures show a marked thoracic dextroscoliosis. IMPRESSION: 1. Prominent bronchovascular markings with left lung interstitial and hazy intervening airspace opacities. Findings on the left may be due to bronchovascular crowding and atelectasis. Consider infection there are consistent clinical findings. No over pulmonary edema. 2. Stable mild cardiomegaly. 3. Dextroscoliosis. Electronically Signed   By: Lajean Manes M.D.   On: 06/09/2019 14:25   DG Hip Unilat W or Wo Pelvis 2-3 Views Right  Result Date: 06/09/2019 CLINICAL DATA:  Stage IV pressure ulcer. Concern for osteomyelitis right ischium. EXAM: DG HIP (WITH OR WITHOUT PELVIS) 2-3V RIGHT COMPARISON:  None. FINDINGS: There is moderate diffuse osteopenia. There is no discernible left femoral head or neck as there is superior subluxation of the adjacent trochanteric portion of the left femur likely chronic deformity. Severe degenerative changes of the right hip with irregular appearance of the femoral head. Right hip joint is not well-defined as there may be partial chronic ankylosis. There is a 6.6 cm lucent defect overlying the right proximal femur likely representing  air in the superimposed soft tissues compatible patient's known soft tissue ulcer. There is no focal lytic process over the right ischium. Chronic infection of the right hip joint is possible. There are degenerative changes of the spine. IMPRESSION: 1. 6.6 cm air collection within the soft tissues overlying the right hip likely involving the posterior tissues compatible patient's known soft tissue ulceration. No definite bone destruction of the adjacent right ischium. Associated chronic deformity of the right hip joint and femoral head as chronic infection is possible. 2.  Left hip deformity as described suggesting chronic deformity. Electronically Signed  By: Marin Olp M.D.   On: 06/09/2019 16:05   EKG: Independently reviewed.   Assessment/Plan  Sepsis secondary to UTI with chronic indwelling foley catheter/cellulitis/PEG tube infection WBC of 11.1, lactate of 4.6 UA + nitrate and large leukocytes and many bacteria with budding yeast purulent drainage from PEG- culture pending- will need to consult surgery vs ID  purulent drainage seen on wound on left ear COVID test pending- suspicious given elevated LFTs Continue IV vancomycin and Cefepime. Pt presented already on IV Vancomycin for unknown indication. He was previously only discharged on IV cefepime for 2 weeks in October for pseudomonas pneumonia. Will need to touch base with SNF in the morning to clarify.  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 Follow with CBC.   Transaminitis/elevated alkaline phosphatase  AST of 114, ALT of 52, alkaline phosphatase of 1196 Check GGT to evaluate if intrahepatic source  Follow CMP in the morning  Cerebral Palsy with Intellectual disability Baseline status is unknown    Epilepsy Continue Keppra, Valproic acid   HTN Hold Cardizem, Lopressor given borderline BP Hold Lasix   DM He appears to only be on SSI at home Cover with moderate-scale SSI  Tube feeding  Consult dietician  See above regarding potential removal of PEG due to it being potential source of infection    DVT prophylaxis:.Lovenox Code Status: DNR Family Communication: No family at bedside disposition Plan: SNF with at least 2 midnight stays  Consults called:  Admission status: inpatient  Madai Nuccio T Daquawn Seelman DO [facility]   If 7PM-7AM, please contact night-coverage [email or web address]   06/09/2019, 8:13 PM

## 2019-06-10 DIAGNOSIS — D75839 Thrombocytosis, unspecified: Secondary | ICD-10-CM

## 2019-06-10 DIAGNOSIS — L89319 Pressure ulcer of right buttock, unspecified stage: Secondary | ICD-10-CM

## 2019-06-10 DIAGNOSIS — R7401 Elevation of levels of liver transaminase levels: Secondary | ICD-10-CM

## 2019-06-10 DIAGNOSIS — I10 Essential (primary) hypertension: Secondary | ICD-10-CM

## 2019-06-10 DIAGNOSIS — D473 Essential (hemorrhagic) thrombocythemia: Secondary | ICD-10-CM

## 2019-06-10 DIAGNOSIS — Z931 Gastrostomy status: Secondary | ICD-10-CM

## 2019-06-10 DIAGNOSIS — E870 Hyperosmolality and hypernatremia: Secondary | ICD-10-CM

## 2019-06-10 DIAGNOSIS — D649 Anemia, unspecified: Secondary | ICD-10-CM

## 2019-06-10 LAB — COMPREHENSIVE METABOLIC PANEL
ALT: 41 U/L (ref 0–44)
AST: 80 U/L — ABNORMAL HIGH (ref 15–41)
Albumin: 1.1 g/dL — ABNORMAL LOW (ref 3.5–5.0)
Alkaline Phosphatase: 995 U/L — ABNORMAL HIGH (ref 38–126)
Anion gap: 8 (ref 5–15)
BUN: 37 mg/dL — ABNORMAL HIGH (ref 8–23)
CO2: 37 mmol/L — ABNORMAL HIGH (ref 22–32)
Calcium: 9.3 mg/dL (ref 8.9–10.3)
Chloride: 109 mmol/L (ref 98–111)
Creatinine, Ser: 0.7 mg/dL (ref 0.61–1.24)
GFR calc Af Amer: >60 mL/min (ref >60)
GFR calc non Af Amer: >60 mL/min (ref >60)
Glucose, Bld: 89 mg/dL (ref 70–99)
Potassium: 3.7 mmol/L (ref 3.5–5.1)
Sodium: 154 mmol/L — ABNORMAL HIGH (ref 135–145)
Total Bilirubin: 0.8 mg/dL (ref 0.3–1.2)
Total Protein: 6 g/dL — ABNORMAL LOW (ref 6.5–8.1)

## 2019-06-10 LAB — CBC
HCT: 22.3 % — ABNORMAL LOW (ref 39.0–52.0)
Hemoglobin: 6.9 g/dL — CL (ref 13.0–17.0)
MCH: 25.8 pg — ABNORMAL LOW (ref 26.0–34.0)
MCHC: 30.9 g/dL (ref 30.0–36.0)
MCV: 83.5 fL (ref 80.0–100.0)
Platelets: 398 10*3/uL (ref 150–400)
RBC: 2.67 MIL/uL — ABNORMAL LOW (ref 4.22–5.81)
RDW: 19.8 % — ABNORMAL HIGH (ref 11.5–15.5)
WBC: 11.2 10*3/uL — ABNORMAL HIGH (ref 4.0–10.5)
nRBC: 1.8 % — ABNORMAL HIGH (ref 0.0–0.2)

## 2019-06-10 LAB — HEMOGLOBIN AND HEMATOCRIT, BLOOD
HCT: 21.7 % — ABNORMAL LOW (ref 39.0–52.0)
Hemoglobin: 6.9 g/dL — CL (ref 13.0–17.0)

## 2019-06-10 LAB — BASIC METABOLIC PANEL
Anion gap: 7 (ref 5–15)
BUN: 38 mg/dL — ABNORMAL HIGH (ref 8–23)
CO2: 38 mmol/L — ABNORMAL HIGH (ref 22–32)
Calcium: 8.9 mg/dL (ref 8.9–10.3)
Chloride: 108 mmol/L (ref 98–111)
Creatinine, Ser: 0.74 mg/dL (ref 0.61–1.24)
GFR calc Af Amer: >60 mL/min (ref >60)
GFR calc non Af Amer: >60 mL/min (ref >60)
Glucose, Bld: 133 mg/dL — ABNORMAL HIGH (ref 70–99)
Potassium: 3.2 mmol/L — ABNORMAL LOW (ref 3.5–5.1)
Sodium: 153 mmol/L — ABNORMAL HIGH (ref 135–145)

## 2019-06-10 LAB — GLUCOSE, CAPILLARY
Glucose-Capillary: 127 mg/dL — ABNORMAL HIGH (ref 70–99)
Glucose-Capillary: 140 mg/dL — ABNORMAL HIGH (ref 70–99)
Glucose-Capillary: 149 mg/dL — ABNORMAL HIGH (ref 70–99)
Glucose-Capillary: 137 mg/dL — ABNORMAL HIGH (ref 70–99)

## 2019-06-10 LAB — SARS CORONAVIRUS 2 (TAT 6-24 HRS): SARS Coronavirus 2: NEGATIVE

## 2019-06-10 LAB — PREPARE RBC (CROSSMATCH)

## 2019-06-10 LAB — MRSA PCR SCREENING: MRSA by PCR: POSITIVE — AB

## 2019-06-10 LAB — ABO/RH: ABO/RH(D): A POS

## 2019-06-10 LAB — LACTIC ACID, PLASMA: Lactic Acid, Venous: 2.9 mmol/L (ref 0.5–1.9)

## 2019-06-10 MED ORDER — ACETAMINOPHEN 650 MG RE SUPP
325.0000 mg | RECTAL | Status: DC | PRN
  Administered 2019-06-10: 04:00:00 325 mg via RECTAL
  Filled 2019-06-10: qty 1

## 2019-06-10 MED ORDER — LEVETIRACETAM 100 MG/ML PO SOLN
1000.0000 mg | Freq: Two times a day (BID) | ORAL | Status: DC
  Administered 2019-06-10 – 2019-06-13 (×9): 1000 mg
  Filled 2019-06-10 (×11): qty 10

## 2019-06-10 MED ORDER — SODIUM CHLORIDE 0.9% IV SOLUTION: Freq: Once | INTRAVENOUS | Status: AC

## 2019-06-10 MED ORDER — INSULIN ASPART 100 UNIT/ML ~~LOC~~ SOLN
0.0000 [IU] | Freq: Three times a day (TID) | SUBCUTANEOUS | Status: DC
  Administered 2019-06-10 (×3): 2 [IU] via SUBCUTANEOUS
  Administered 2019-06-11 (×3): 3 [IU] via SUBCUTANEOUS
  Administered 2019-06-12: 18:00:00 2 [IU] via SUBCUTANEOUS
  Administered 2019-06-12 (×2): 3 [IU] via SUBCUTANEOUS
  Administered 2019-06-13: 13:00:00 5 [IU] via SUBCUTANEOUS
  Administered 2019-06-13 (×2): 3 [IU] via SUBCUTANEOUS
  Administered 2019-06-14: 5 [IU] via SUBCUTANEOUS

## 2019-06-10 MED ORDER — CHLORHEXIDINE GLUCONATE 0.12 % MT SOLN
15.0000 mL | Freq: Two times a day (BID) | OROMUCOSAL | Status: DC
  Administered 2019-06-10 – 2019-06-11 (×3): 15 mL via OROMUCOSAL
  Filled 2019-06-10 (×3): qty 15

## 2019-06-10 MED ORDER — HYDRALAZINE HCL 20 MG/ML IJ SOLN
10.0000 mg | Freq: Four times a day (QID) | INTRAMUSCULAR | Status: DC | PRN
  Filled 2019-06-10: qty 1

## 2019-06-10 MED ORDER — DAKINS (1/4 STRENGTH) 0.125 % EX SOLN
Freq: Two times a day (BID) | CUTANEOUS | Status: AC
  Filled 2019-06-10: qty 473

## 2019-06-10 MED ORDER — PRO-STAT SUGAR FREE PO LIQD
30.0000 mL | Freq: Every day | ORAL | Status: DC
  Administered 2019-06-11 – 2019-06-13 (×3): 30 mL
  Filled 2019-06-10 (×3): qty 30

## 2019-06-10 MED ORDER — INSULIN ASPART 100 UNIT/ML ~~LOC~~ SOLN: 0.0000 [IU] | Freq: Three times a day (TID) | SUBCUTANEOUS | Status: DC

## 2019-06-10 MED ORDER — ORAL CARE MOUTH RINSE
15.0000 mL | Freq: Two times a day (BID) | OROMUCOSAL | Status: DC
  Administered 2019-06-10 – 2019-06-13 (×7): 15 mL via OROMUCOSAL

## 2019-06-10 MED ORDER — ZINC OXIDE 40 % EX OINT
TOPICAL_OINTMENT | Freq: Three times a day (TID) | CUTANEOUS | Status: DC
  Filled 2019-06-10: qty 57

## 2019-06-10 MED ORDER — OSMOLITE 1.5 CAL PO LIQD
1000.0000 mL | ORAL | Status: DC
  Administered 2019-06-10 – 2019-06-13 (×3): 1000 mL
  Filled 2019-06-10 (×5): qty 1000

## 2019-06-10 MED ORDER — DAKINS (1/4 STRENGTH) 0.125 % EX SOLN
Freq: Two times a day (BID) | CUTANEOUS | Status: DC
  Filled 2019-06-10: qty 473

## 2019-06-10 MED ORDER — OSMOLITE 1.2 CAL PO LIQD: 1000.0000 mL | ORAL | Status: DC

## 2019-06-10 MED ORDER — DEXTROSE 5 % IV SOLN: INTRAVENOUS | Status: DC

## 2019-06-10 MED ORDER — ALTEPLASE 2 MG IJ SOLR
2.0000 mg | Freq: Once | INTRAMUSCULAR | Status: AC
  Administered 2019-06-10: 2 mg

## 2019-06-10 MED ORDER — ALTEPLASE 2 MG IJ SOLR
2.0000 mg | Freq: Once | INTRAMUSCULAR | Status: AC
  Administered 2019-06-10: 06:00:00 2 mg

## 2019-06-10 MED ORDER — FREE WATER
200.0000 mL | Freq: Three times a day (TID) | Status: AC
  Administered 2019-06-10 – 2019-06-11 (×3): 200 mL

## 2019-06-10 MED ORDER — CHLORHEXIDINE GLUCONATE CLOTH 2 % EX PADS
6.0000 | MEDICATED_PAD | Freq: Every day | CUTANEOUS | Status: DC
  Administered 2019-06-10: 6 via TOPICAL

## 2019-06-10 MED ORDER — VALPROATE SODIUM 250 MG/5ML PO SOLN
1000.0000 mg | Freq: Three times a day (TID) | ORAL | Status: DC
  Administered 2019-06-10 – 2019-06-13 (×13): 1000 mg
  Filled 2019-06-10 (×18): qty 20

## 2019-06-10 MED ORDER — POTASSIUM CHLORIDE 10 MEQ/100ML IV SOLN
10.0000 meq | INTRAVENOUS | Status: AC
  Administered 2019-06-10 (×4): 10 meq via INTRAVENOUS
  Filled 2019-06-10 (×4): qty 100

## 2019-06-10 NOTE — Progress Notes (Signed)
PROGRESS NOTE  Roy Crawford [id number] DOB: 11-May-1956 DOA: 06/09/2019 PCP: [name], MD   LOS: 1 day   Brief narrative:  As per HPI,  Roy Crawford is a 64 y.o. male with medical history significant for cerebral palsy with Intellectual disability, quadriplegia and tracheostomy dependency, chronic contractures, epilepsy and recent refractory pseudomonas pneumonia requiring baseline 8L via trach and outpatient IV antibiotics, resident at [facility]. Reportedly sent over for possible worsening ulcer. Patient is nonverbal at baseline and unable to provide history. In the ED: He was febrile up to 100.4, tachypneic at baseline 8L, 35% FiO2 through tracheostomy collar. WBC of 11.7, hemoglobin of 7.4 down from 8.5, platelet of 411. Lactate of 4.6. Na of 155, glucose of 127, AST of 114, ALT of 52, alkaline phosphatase of 1196 UA with positive nitrate and large leukocytes and many bacteria with budding yeast. He was given Tylenol suppository, 3L of LR fluid resuscitation, and started on vancomycin and cefepime.  Assessment/Plan:  Principal Problem:   Sepsis ([place]) Active Problems:   Tracheostomy dependent (HCC)   Epilepsy (HCC)   Cerebral palsy, quadriplegic (HCC)   Pressure ulcer of right buttock   Hypernatremia   Anemia   Thrombocytosis (HCC)   Essential hypertension   PEG (percutaneous endoscopic gastrostomy) status (HCC)   Transaminitis  Sepsis secondary to UTI with chronic indwelling foley catheter/cellulitis/PEG tube infection Urinalysis also showed some budding yeast.  Purulent discharge from the PEG tube.  Continue IV vancomycin and Cefepime. Pt presented already on IV Vancomycin for unknown indication. He was previously only discharged on IV cefepime for 2 weeks in October for pseudomonas pneumonia. On fluconazole for yeast seen in UA   Elevated LFts.  Likely from sepsis.  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 Pending wound culture-  although does not appear infected  No definitive osteomyelitis seen on imaging  Posterior auricle wound bilaterally Wound care consulted   Hypernatremia  Na of 155 on presentation. Sodium of 150s at this time.  Started on D5 water. Tube water flushes.  Chronic anemia  hemoglobin of 6.9 down from 8.5.  Transfuse 1 unit of packed RBC today  Mild thrombocytosis  Reactive. Follow with CBC.   Transaminitis/elevated alkaline phosphatase  AST of 114, ALT of 52, alkaline phosphatase of 1196.  Would be from sepsis.  Trend LFTs  Cerebral Palsy with Intellectual disability Patient is nonverbal and contracted  Epilepsy Continue Keppra, Valproic acid  HTN Hold Cardizem, Lopressor given borderline BP Hold Lasix  DM He appears to only be on SSI , will continue with sliding scale insulin Accu-Cheks.  Tube feeding  Consult dietician    VTE Prophylaxis: SCD  Code Status: DNR.  Confirmed with the patient's nephew  Family Communication: Spoke with the patient's nephew on the phone in detail today.  Spoke about the possible benefit from hospice and palliative care.  He is interested in hospice discussion.  We will consult social workers.  Patient is  status post trach PEG with recurrent infection and stage IV sacral ulcer with gross contractures.  Overall prognosis of the patient is poor with chances of aspiration pneumonia, recurrent infections.  Patient recently had resistant pseudomonal infection.  Disposition Plan: Patient is from [facility] to skilled nursing facility.  Will consult social workers for hospice discussion.   Consultants:  None  Procedures:  None  Antibiotics: . Vancomycin, cefepime, fluconazole 06/09/2019>  Anti-infectives (From admission, onward)   Start     Dose/Rate Route Frequency Ordered  Stop   06/10/19 0230  ceFEPIme (MAXIPIME) 2 g in sodium chloride 0.9 % 100 mL IVPB     2 g 200 mL/hr over 30 Minutes Intravenous Every 8 hours 06/09/19 1942      06/09/19 2045  fluconazole (DIFLUCAN) tablet 200 mg     200 mg Oral Daily 06/09/19 2040     06/09/19 1945  vancomycin (VANCOREADY) IVPB 1250 mg/250 mL     1,250 mg 166.7 mL/hr over 90 Minutes Intravenous Every 24 hours 06/09/19 1942     06/09/19 1815  ceFEPIme (MAXIPIME) 2 g in sodium chloride 0.9 % 100 mL IVPB     2 g 200 mL/hr over 30 Minutes Intravenous  Once 06/09/19 1815 06/09/19 1910     Subjective: Today, patient is nonverbal.  History of cerebral palsy and mental retardation.  Objective: Vitals:   06/10/19 0630 06/10/19 0645  BP: 129/73 105/86  Pulse: 94 95  Resp: 16 19  Temp:    SpO2: 99% 100%    Intake/Output Summary (Last 24 hours) at 06/10/2019 0945 Last data filed at 06/10/2019 0600 Gross per 24 hour  Intake 1084.68 ml  Output 1900 ml  Net -815.32 ml   Filed Weights   06/09/19 1300 06/10/19 0515  Weight: 84 kg 79 kg   Body mass index is 37.69 kg/m.   Physical Exam: GENERAL: Patient is nonverbal. Not in obvious distress. HENT: No scleral pallor or icterus. Pupils equally reactive to light. Oral mucosa is moist -secretions. auricles with wound NECK: is supple, tracheostomy collar in place, 5 L of oxygen CHEST: Diminished breath sounds bilaterally with mild rhonchi CVS: S1 and S2 heard, no murmur. Regular rate and rhythm.  ABDOMEN: Soft, non-tender, bowel sounds are present.  PEG tube in place with mild purulent discharge.  Chronic indwelling Foley catheter EXTREMITIES: Contractures of 4 extremities CNS: Nonverbal, contractures of the extremities.  Does not follow command. SKIN: warm and dry  Data Review: I have personally reviewed the following laboratory data and studies,  CBC: Recent Labs  Lab 06/09/19 1710 06/10/19 0250  WBC 11.7* 11.2*  NEUTROABS 8.8*  --   HGB 7.4* 6.9*  HCT 24.8* 22.3*  MCV 85.2 83.5  PLT 411* [id number]   Basic Metabolic Panel: Recent Labs  Lab 06/09/19 1710 06/09/19 2123 06/10/19 0250 06/10/19 0816  NA 155* 155* 154* 153*   K 3.8 3.6 3.7 3.2*  CL 105 106 109 108  CO2 38* 36* 37* 38*  GLUCOSE 127* 101* 89 133*  BUN 47* 42* 37* 38*  CREATININE 0.80 0.75 0.70 0.74  CALCIUM 9.7 9.3 9.3 8.9   Liver Function Tests: Recent Labs  Lab 06/09/19 1710 06/10/19 0250  AST 114* 80*  ALT 52* 41  ALKPHOS 1,195* 995*  BILITOT 0.5 0.8  PROT 6.6 6.0*  ALBUMIN 1.2* 1.1*   No results for input(s): LIPASE, AMYLASE in the last 168 hours. No results for input(s): AMMONIA in the last 168 hours. Cardiac Enzymes: No results for input(s): CKTOTAL, CKMB, CKMBINDEX, TROPONINI in the last 168 hours. BNP (last 3 results) Recent Labs    03/05/19 1830  BNP 655.1*    ProBNP (last 3 results) No results for input(s): PROBNP in the last 8760 hours.  CBG: Recent Labs  Lab 06/10/19 0522  GLUCAP 127*   Recent Results (from the past 240 hour(s))  Aerobic Culture (superficial specimen)     Status: None (Preliminary result)   Collection Time: 06/09/19  8:16 PM   Specimen: Ulcer  Result Value  Ref Range Status   Specimen Description ULCER RIGHT BUTTOCKS  Final   Special Requests NONE  Final   Gram Stain   Final    ABUNDANT WBC PRESENT, PREDOMINANTLY PMN ABUNDANT GRAM NEGATIVE RODS RARE GRAM POSITIVE COCCI    Culture   Final    ABUNDANT GRAM NEGATIVE RODS CULTURE REINCUBATED FOR BETTER GROWTH Performed at [facility], [address] [place], [place] [address]    Report Status PENDING  Incomplete  Nasopharyngeal Culture     Status: None (Preliminary result)   Collection Time: 06/09/19  9:01 PM   Specimen: Nasal Mucosa  Result Value Ref Range Status   Specimen Description NASOPHARYNGEAL  Final   Special Requests NONE  Final   Culture   Final    CULTURE REINCUBATED FOR BETTER GROWTH Performed at [facility], [address], [place], [place] [address]    Report Status PENDING  Incomplete  SARS CORONAVIRUS 2 (TAT 6-24 HRS) Nasopharyngeal Nasopharyngeal Swab     Status: None   Collection Time: 06/09/19   9:14 PM   Specimen: Nasopharyngeal Swab  Result Value Ref Range Status   SARS Coronavirus 2 NEGATIVE NEGATIVE Final    Comment: (NOTE) SARS-CoV-2 target nucleic acids are NOT DETECTED. The SARS-CoV-2 RNA is generally detectable in upper and lower respiratory specimens during the acute phase of infection. Negative results do not preclude SARS-CoV-2 infection, do not rule out co-infections with other pathogens, and should not be used as the sole basis for treatment or other patient management decisions. Negative results must be combined with clinical observations, patient history, and epidemiological information. The expected result is Negative. Fact Sheet for Patients: SugarRoll.be Fact Sheet for Healthcare Providers: https://www.woods-mathews.com/ This test is not yet approved or cleared by the [place] FDA and  has been authorized for detection and/or diagnosis of SARS-CoV-2 by FDA under an Emergency Use Authorization (EUA). This EUA will remain  in effect (meaning this test can be used) for the duration of the COVID-19 declaration under Section 56 4(b)(1) of the Act, 21 U.S.C. section 360bbb-3(b)(1), unless the authorization is terminated or revoked sooner. Performed at [facility], [place] [address] [place], [place] [address]   MRSA PCR Screening     Status: Abnormal   Collection Time: 06/10/19  5:14 AM   Specimen: Nasal Mucosa; Nasopharyngeal  Result Value Ref Range Status   MRSA by PCR POSITIVE (A) NEGATIVE Final    Comment:        The GeneXpert MRSA Assay (FDA approved for NASAL specimens only), is one component of a comprehensive MRSA colonization surveillance program. It is not intended to diagnose MRSA infection nor to guide or monitor treatment for MRSA infections. RESULT CALLED TO, READ BACK BY AND VERIFIED WITH: RN EU, T. [phone] FCP Performed at [facility], [place] [address] [place], [place]  [address]      Studies: DG Chest Port 1 View  Result Date: 06/09/2019 CLINICAL DATA:  Tachypnea.  Sacral pressure ulcer. EXAM: PORTABLE CHEST 1 VIEW COMPARISON:  03/05/2019 FINDINGS: Cardiac silhouette is mildly enlarged. No mediastinal or hilar masses. There are prominent bronchovascular markings. Interstitial and hazy airspace opacities are noted in the left lung centrally. No convincing pleural effusion.  No pneumothorax. Right PICC tip projects in the mid superior vena cava. Tracheostomy tube tip projects in the upper thoracic trachea. Skeletal structures show a marked thoracic dextroscoliosis. IMPRESSION: 1. Prominent bronchovascular markings with left lung interstitial and hazy intervening  airspace opacities. Findings on the left may be due to bronchovascular crowding and atelectasis. Consider infection there are consistent clinical findings. No over pulmonary edema. 2. Stable mild cardiomegaly. 3. Dextroscoliosis. Electronically Signed   By: Lajean Manes M.D.   On: 06/09/2019 14:25   DG Hip Unilat W or Wo Pelvis 2-3 Views Right  Result Date: 06/09/2019 CLINICAL DATA:  Stage IV pressure ulcer. Concern for osteomyelitis right ischium. EXAM: DG HIP (WITH OR WITHOUT PELVIS) 2-3V RIGHT COMPARISON:  None. FINDINGS: There is moderate diffuse osteopenia. There is no discernible left femoral head or neck as there is superior subluxation of the adjacent trochanteric portion of the left femur likely chronic deformity. Severe degenerative changes of the right hip with irregular appearance of the femoral head. Right hip joint is not well-defined as there may be partial chronic ankylosis. There is a 6.6 cm lucent defect overlying the right proximal femur likely representing air in the superimposed soft tissues compatible patient's known soft tissue ulcer. There is no focal lytic process over the right ischium. Chronic infection of the right hip joint is possible. There are degenerative changes of the spine.  IMPRESSION: 1. 6.6 cm air collection within the soft tissues overlying the right hip likely involving the posterior tissues compatible patient's known soft tissue ulceration. No definite bone destruction of the adjacent right ischium. Associated chronic deformity of the right hip joint and femoral head as chronic infection is possible. 2.  Left hip deformity as described suggesting chronic deformity. Electronically Signed   By: Marin Olp M.D.   On: 06/09/2019 16:05     Flora Lipps, MD  [facility] 06/10/2019

## 2019-06-10 NOTE — Progress Notes (Signed)
Initial Nutrition Assessment  DOCUMENTATION CODES:   Obesity unspecified  INTERVENTION:   -Initiate Osmolite 1.5 @ 50 ml/hr via PEG  30 ml Prostat daily.  Once no IVFS, add 200 ml free water flush 4 times daily    Tube feeding regimen provides 1900 kcal (100% of needs), 90 grams of protein, and 914 ml of H2O.   NUTRITION DIAGNOSIS:   Increased nutrient needs related to wound healing as evidenced by estimated needs.  GOAL:   Patient will meet greater than or equal to 90% of their needs  MONITOR:   Labs, Weight trends, TF tolerance, Skin, I & O's  REASON FOR ASSESSMENT:   Consult Enteral/tube feeding initiation and management  ASSESSMENT:   Roy Crawford is a 64 y.o. male with medical history significant for cerebral palsy with Intellectual disability, quadriplegia and tracheostomy dependency, chronic contractures, Epilepsy and recent refractory pseudomonas pneumonia requiring baseline 8L via trach and outpatient IV antibiotics.  Pt admitted with sepsis secondary to UTI with chronic indwelling foley catheter/ cellulitis/ PEG stube infection.   Reviewed I/O's: -815 ml x 24 hours   UOP: 1.9 L x 24 hours  Pt from [facility]. No MAR available to review at this time.   Pt on trach collar. No family present to provide additional history. Pt did not respond to touch or voice. He is PEG dependent, but unsure of PTA TF regimen.   Reviewed wt hx, which reveals a 6% wt loss over the past 3 months, which is not significant for time frame. Suspect edema may be masking true weight loss as well as further fat and muscle depletions.   Per MD notes, POA interested in hospice services.   Medications reviewed and include dextrose 5% solution @ 100 ml/hr.   Labs reviewed: Na: 154, CBGS: 127 (inpatient orders for glycemic control are 0-15 units insulin aspart TID with meals).   NUTRITION - FOCUSED PHYSICAL EXAM:    Most Recent Value  Orbital Region  Moderate depletion  Upper  Arm Region  No depletion  Thoracic and Lumbar Region  No depletion  Buccal Region  No depletion  Temple Region  Mild depletion  Clavicle Bone Region  No depletion  Clavicle and Acromion Bone Region  No depletion  Scapular Bone Region  No depletion  Dorsal Hand  Moderate depletion  Patellar Region  No depletion  Anterior Thigh Region  No depletion  Posterior Calf Region  No depletion  Edema (RD Assessment)  Moderate  Hair  Reviewed  Eyes  Reviewed  Mouth  Reviewed  Skin  Reviewed  Nails  Reviewed       Diet Order:   Diet Order            Diet NPO time specified  Diet effective now              EDUCATION NEEDS:   Not appropriate for education at this time  Skin:  Skin Assessment: Skin Integrity Issues: Skin Integrity Issues:: Stage IV, Stage II Stage II: scrotum, lt and rt ear Stage IV: rt buttock  Last BM:  06/09/19  Height:   Ht Readings from Last 1 Encounters:  06/09/19 4\' 9"  (1.448 m)    Weight:   Wt Readings from Last 1 Encounters:  06/10/19 79 kg    Ideal Body Weight:  43.2 kg  BMI:  Body mass index is 37.69 kg/m.  Estimated Nutritional Needs:   Kcal:  1700-1900  Protein:  90-110 grams  Fluid:  > 1.7  L    Sheray Grist A. Jimmye Norman, RD, LDN, [place] Registered Dietitian II Certified Diabetes Care and Education Specialist Pager: [phone] After hours Pager: [phone]

## 2019-06-10 NOTE — ED Notes (Signed)
CRITICAL LAB - Lactic acid 2.9, messaged Dr. Ileene Musa

## 2019-06-10 NOTE — Consult Note (Signed)
[facility] Nurse Consult Note: Patient receiving care in [place].  Primary RN in room at time of assessment. Reason for Consult: Multiple wounds Wound type: The posterior scrotum is significantly impacted by MASD, much of the skin of the posterior scrotum has peeled off leaving a pink partial thickness wound bed. There is a 100% pink, partial thickness abrasion to the right buttock.  These areas can be best protected by TID application of [id number] Desitin ointment. The right ischium has a stage 4 wound with exposed bone and the bone is grey in color. The wound measures 8 cm x 7 cm x 6 cm with 1.5 cm undermining from 4 to 9 o'clock.  The tissue of the wound is pale pink and slick.  I did not detect an odor, however, there is heavy serosanginous drainage.  Dakin's solution will be the treatment for this wound. The right ear crease at the top of the ear has a full thickness wound that measures 1.5 cm x 1 cmx 0.5 cm and is 100% yellow.  The left ear has a very similar wound in the exact same location.  It is also 100% yellow and measures 1 cm x 1 cm 0.5 cm.  These wounds can be managed by cutting a sliver of Aquacel Kellie Simmering [phone]) and placing into the wound bed, change daily.. The PEG tube site does NOT have any type of wound.  Care for this area is: Cleanse around the PEG tube with soap and water. Pat dry. Place a split gauze under the PEG tube bumper. Pressure Injury POA: Yes Monitor the wound area(s) for worsening of condition such as: Signs/symptoms of infection,  Increase in size,  Development of or worsening of odor, Development of pain, or increased pain at the affected locations.  Notify the medical team if any of these develop.  Thank you for the consult.  Discussed plan of care with the bedside nurse.  [facility] nurse will not follow at this time.  Please re-consult the [facility] team if needed.  Val Riles, RN, MSN, CWOCN, CNS-BC, pager [phone]

## 2019-06-10 NOTE — ED Notes (Signed)
Patient trach suctioned to clear secretions.

## 2019-06-10 NOTE — Progress Notes (Signed)
Patient received on 6 Cuffed Portex trach without an inner cannula.   A #6 and #4 Shiley will be ordered and placed at bedside. Water bottle full. Trach care complete.

## 2019-06-10 NOTE — ED Notes (Signed)
Patient temperature 100.8.  MD paged with request for PRN fever medication

## 2019-06-10 NOTE — ED Notes (Signed)
Difficulty flushing PICC line.  Started PIV to be able to initiate maintenance IV fluids and antibiotics.

## 2019-06-10 NOTE — Progress Notes (Addendum)
Admit from ed, cbg 127

## 2019-06-10 NOTE — Progress Notes (Signed)
Initial oral care done with oral rinse and tooth brush. Patient has very poor dentition and had a dried coating on tongue and roof of mouth. Teeth brushed with oral rinse and gums and oral mucosa coated with oral rinse using toothette sponge.         06/10/19 1400  Oral Assessment (Complete on admission/transfer/change in patient condition)  Does patient have any of the following "high(er) risk" factors? Tracheostomy with trach collar 24 hrs./day;Nutritional status - fluids only or NPO for >24 hours;Diet - patient on tube feedings  Hygiene  Oral Care Teeth brushed;Moisturizer applied to oral mucosa and lips;Mouth swabbed;Mouth suctioned;Suction toothbrush;with antiseptic oral rinse;with mouthwash

## 2019-06-10 NOTE — ED Notes (Signed)
Dr. Ileene Musa messaged to inform of CRITICAL LAB:  Hemoglobin 6.9

## 2019-06-10 NOTE — Progress Notes (Signed)
PICC declotting: Blood return noted from red and grey lumen. STOP cap applied to white lumen. Rona Ravens, RN instructed not to use. Will allow tPA to dwell longer.

## 2019-06-11 DIAGNOSIS — Z7189 Other specified counseling: Secondary | ICD-10-CM

## 2019-06-11 DIAGNOSIS — Z515 Encounter for palliative care: Secondary | ICD-10-CM

## 2019-06-11 LAB — BPAM RBC
Blood Product Expiration Date: 202102152359
Blood Product Expiration Date: 202102152359
ISSUE DATE / TIME: 202101281402
ISSUE DATE / TIME: 202101282230
Unit Type and Rh: 6200
Unit Type and Rh: 6200

## 2019-06-11 LAB — GLUCOSE, CAPILLARY
Glucose-Capillary: 189 mg/dL — ABNORMAL HIGH (ref 70–99)
Glucose-Capillary: 143 mg/dL — ABNORMAL HIGH (ref 70–99)
Glucose-Capillary: 156 mg/dL — ABNORMAL HIGH (ref 70–99)
Glucose-Capillary: 168 mg/dL — ABNORMAL HIGH (ref 70–99)
Glucose-Capillary: 188 mg/dL — ABNORMAL HIGH (ref 70–99)
Glucose-Capillary: 189 mg/dL — ABNORMAL HIGH (ref 70–99)
Glucose-Capillary: 149 mg/dL — ABNORMAL HIGH (ref 70–99)

## 2019-06-11 LAB — BLOOD CULTURE ID PANEL (REFLEXED)

## 2019-06-11 LAB — TYPE AND SCREEN
ABO/RH(D): A POS
Antibody Screen: NEGATIVE
Unit division: 0
Unit division: 0

## 2019-06-11 LAB — COMPREHENSIVE METABOLIC PANEL
ALT: 30 U/L (ref 0–44)
AST: 76 U/L — ABNORMAL HIGH (ref 15–41)
Albumin: <1 g/dL — ABNORMAL LOW (ref 3.5–5.0)
Alkaline Phosphatase: 789 U/L — ABNORMAL HIGH (ref 38–126)
Anion gap: 8 (ref 5–15)
BUN: 30 mg/dL — ABNORMAL HIGH (ref 8–23)
CO2: 32 mmol/L (ref 22–32)
Calcium: 8.1 mg/dL — ABNORMAL LOW (ref 8.9–10.3)
Chloride: 105 mmol/L (ref 98–111)
Creatinine, Ser: 0.93 mg/dL (ref 0.61–1.24)
GFR calc Af Amer: >60 mL/min (ref >60)
GFR calc non Af Amer: >60 mL/min (ref >60)
Glucose, Bld: 183 mg/dL — ABNORMAL HIGH (ref 70–99)
Potassium: 3 mmol/L — ABNORMAL LOW (ref 3.5–5.1)
Sodium: 145 mmol/L (ref 135–145)
Total Bilirubin: 0.9 mg/dL (ref 0.3–1.2)
Total Protein: 5.3 g/dL — ABNORMAL LOW (ref 6.5–8.1)

## 2019-06-11 LAB — PHOSPHORUS: Phosphorus: 3.6 mg/dL (ref 2.5–4.6)

## 2019-06-11 LAB — URINE CULTURE: Culture: >=100000 — AB

## 2019-06-11 LAB — CBC
HCT: 25.5 % — ABNORMAL LOW (ref 39.0–52.0)
Hemoglobin: 8 g/dL — ABNORMAL LOW (ref 13.0–17.0)
MCH: 27 pg (ref 26.0–34.0)
MCHC: 31.4 g/dL (ref 30.0–36.0)
MCV: 86.1 fL (ref 80.0–100.0)
Platelets: 299 10*3/uL (ref 150–400)
RBC: 2.96 MIL/uL — ABNORMAL LOW (ref 4.22–5.81)
RDW: 17.8 % — ABNORMAL HIGH (ref 11.5–15.5)
WBC: 8.3 10*3/uL (ref 4.0–10.5)
nRBC: 2.2 % — ABNORMAL HIGH (ref 0.0–0.2)

## 2019-06-11 LAB — MAGNESIUM: Magnesium: 1.7 mg/dL (ref 1.7–2.4)

## 2019-06-11 MED ORDER — POTASSIUM CHLORIDE 10 MEQ/100ML IV SOLN
10.0000 meq | INTRAVENOUS | Status: AC
  Administered 2019-06-11 (×4): 10 meq via INTRAVENOUS
  Filled 2019-06-11 (×4): qty 100

## 2019-06-11 MED ORDER — METOPROLOL TARTRATE 5 MG/5ML IV SOLN: 5.0000 mg | Freq: Four times a day (QID) | INTRAVENOUS | Status: DC | PRN

## 2019-06-11 MED ORDER — CHLORHEXIDINE GLUCONATE 0.12 % MT SOLN
15.0000 mL | Freq: Two times a day (BID) | OROMUCOSAL | Status: DC
  Administered 2019-06-11 – 2019-06-15 (×9): 15 mL via OROMUCOSAL
  Filled 2019-06-11 (×8): qty 15

## 2019-06-11 MED ORDER — NYSTATIN 100000 UNIT/GM EX POWD
1.0000 g | Freq: Two times a day (BID) | CUTANEOUS | Status: DC | PRN
  Filled 2019-06-11: qty 15

## 2019-06-11 MED ORDER — SODIUM CHLORIDE 0.9 % IV BOLUS
500.0000 mL | Freq: Once | INTRAVENOUS | Status: AC
  Administered 2019-06-11: 17:00:00 500 mL via INTRAVENOUS

## 2019-06-11 MED ORDER — PANTOPRAZOLE SODIUM 40 MG PO PACK
40.0000 mg | PACK | Freq: Every day | ORAL | Status: DC
  Administered 2019-06-11 – 2019-06-13 (×3): 40 mg
  Filled 2019-06-11 (×5): qty 20

## 2019-06-11 MED ORDER — LACTULOSE 10 GM/15ML PO SOLN
10.0000 g | Freq: Every day | ORAL | Status: DC
  Administered 2019-06-11 – 2019-06-15 (×5): 10 g
  Filled 2019-06-11 (×5): qty 15

## 2019-06-11 MED ORDER — SODIUM CHLORIDE 0.9 % IV SOLN: INTRAVENOUS | Status: AC

## 2019-06-11 MED ORDER — HYDROCODONE-ACETAMINOPHEN 5-325 MG PO TABS
1.0000 | ORAL_TABLET | Freq: Four times a day (QID) | ORAL | Status: DC | PRN
  Administered 2019-06-11: 1 via ORAL
  Filled 2019-06-11: qty 1

## 2019-06-11 MED ORDER — POLYETHYLENE GLYCOL 3350 17 G PO PACK: 17.0000 g | PACK | Freq: Every day | ORAL | Status: DC | PRN

## 2019-06-11 MED ORDER — ALBUTEROL SULFATE (2.5 MG/3ML) 0.083% IN NEBU: 2.5000 mg | INHALATION_SOLUTION | Freq: Four times a day (QID) | RESPIRATORY_TRACT | Status: DC | PRN

## 2019-06-11 MED ORDER — DILTIAZEM HCL 30 MG PO TABS
30.0000 mg | ORAL_TABLET | Freq: Three times a day (TID) | ORAL | Status: DC
  Administered 2019-06-11 – 2019-06-12 (×3): 30 mg
  Filled 2019-06-11 (×4): qty 1

## 2019-06-11 MED ORDER — ACETAMINOPHEN 325 MG PO TABS
650.0000 mg | ORAL_TABLET | Freq: Four times a day (QID) | ORAL | Status: DC | PRN
  Administered 2019-06-11: 650 mg
  Filled 2019-06-11: qty 2

## 2019-06-11 MED ORDER — MUPIROCIN 2 % EX OINT
1.0000 "application " | TOPICAL_OINTMENT | Freq: Two times a day (BID) | CUTANEOUS | Status: DC
  Administered 2019-06-11 – 2019-06-13 (×6): 1 via NASAL
  Filled 2019-06-11 (×2): qty 22

## 2019-06-11 MED ORDER — METOPROLOL TARTRATE 25 MG PO TABS
25.0000 mg | ORAL_TABLET | Freq: Two times a day (BID) | ORAL | Status: DC
  Administered 2019-06-11 – 2019-06-12 (×2): 25 mg
  Filled 2019-06-11 (×3): qty 1

## 2019-06-11 MED ORDER — CHLORHEXIDINE GLUCONATE CLOTH 2 % EX PADS
6.0000 | MEDICATED_PAD | Freq: Every day | CUTANEOUS | Status: DC
  Administered 2019-06-11 – 2019-06-13 (×2): 6 via TOPICAL

## 2019-06-11 MED ORDER — LORAZEPAM 2 MG/ML IJ SOLN
2.0000 mg | INTRAMUSCULAR | Status: DC | PRN
  Administered 2019-06-12: 19:00:00 2 mg via INTRAVENOUS
  Filled 2019-06-11: qty 1

## 2019-06-11 MED ORDER — METOCLOPRAMIDE HCL 5 MG/5ML PO SOLN
5.0000 mg | Freq: Four times a day (QID) | ORAL | Status: DC
  Administered 2019-06-11 – 2019-06-13 (×9): 5 mg
  Filled 2019-06-11: qty 5
  Filled 2019-06-11 (×2): qty 10
  Filled 2019-06-11: qty 5
  Filled 2019-06-11 (×2): qty 10
  Filled 2019-06-11: qty 5
  Filled 2019-06-11 (×2): qty 10
  Filled 2019-06-11: qty 5
  Filled 2019-06-11 (×2): qty 10
  Filled 2019-06-11: qty 5
  Filled 2019-06-11 (×3): qty 10
  Filled 2019-06-11: qty 5

## 2019-06-11 NOTE — Progress Notes (Signed)
Pharmacy Antibiotic Note  Roy Crawford is a 64 y.o. male admitted on 06/09/2019 with sepsis.  Pharmacy has been consulted for Cefepime and Vancomycin dosing.  Patient currently afebrile, scr stable at 0.9. wbc wnl.   Height: 4\' 9"  (144.8 cm) Weight: 178 lb 2.1 oz (80.8 kg) IBW/kg (Calculated) : 43.1  Temp (24hrs), Avg:98.6 F (37 C), Min:97.9 F (36.6 C), Max:99.7 F (37.6 C)  Recent Labs  Lab 06/09/19 1709 06/09/19 1710 06/09/19 1832 06/09/19 2015 06/09/19 2123 06/10/19 0250 06/10/19 0816 06/11/19 0402 06/11/19 0620  WBC  --  11.7*  --   --   --  11.2*  --  8.3  --   CREATININE  --  0.80  --   --  0.75 0.70 0.74  --  0.93  LATICACIDVEN 4.6*  --   --  6.6*  --  2.9*  --   --   --   VANCORANDOM  --   --  15  --   --   --   --   --   --     Estimated Creatinine Clearance: 64.3 mL/min (by C-G formula based on SCr of 0.93 mg/dL).    Allergies  Allergen Reactions  . Amoxicillin-Pot Clavulanate     Per MAR  . Cephalexin     Per MAR  . Clavulanic Acid     Per MAR  . Penicillins     Per MAR    Antimicrobials this admission: 1/27 Cefepime >>  1/27 Vancomycin >>   Dose adjustments this admission:   Microbiology results: 1/27 right buttocks ulcer: GNR ->reincubate 1/27 BCx: ngtd 1/27 UCx: pseudomonas 1/28 MRSA PCR +  Plan: Continue cefepime 2g IV q8h  Vancomycin adjusted on admit, consider levels this weekend if not stopped -Continue vancomycin 1250mg  IV q24h  - Est Calc AUC 515 - Monitor patients renal function and urine output  Thank you for allowing pharmacy to be a part of this patient's care.  Erin Hearing PharmD., BCPS Clinical Pharmacist 06/11/2019 9:20 AM

## 2019-06-11 NOTE — Progress Notes (Signed)
PHARMACY - PHYSICIAN COMMUNICATION CRITICAL VALUE ALERT - BLOOD CULTURE IDENTIFICATION (BCID)  Roy Crawford is an 64 y.o. male who presented to [facility] on 06/09/2019   Assessment:  Already growing PSA in urine. Now with klebsiella in blood  Name of physician (or Provider) Contacted: Dr Louanne Belton  Current antibiotics: Cefepime Vanc  Changes to prescribed antibiotics recommended:  Continue cefepime DC vanc  Results for orders placed or performed during the hospital encounter of 06/09/19  Blood Culture ID Panel (Reflexed) (Collected: 06/09/2019  5:00 PM)  Result Value Ref Range   Enterococcus species NOT DETECTED NOT DETECTED   Listeria monocytogenes NOT DETECTED NOT DETECTED   Staphylococcus species NOT DETECTED NOT DETECTED   Staphylococcus aureus (BCID) NOT DETECTED NOT DETECTED   Streptococcus species NOT DETECTED NOT DETECTED   Streptococcus agalactiae NOT DETECTED NOT DETECTED   Streptococcus pneumoniae NOT DETECTED NOT DETECTED   Streptococcus pyogenes NOT DETECTED NOT DETECTED   Acinetobacter baumannii NOT DETECTED NOT DETECTED   Enterobacteriaceae species DETECTED (A) NOT DETECTED   Enterobacter cloacae complex NOT DETECTED NOT DETECTED   Escherichia coli NOT DETECTED NOT DETECTED   Klebsiella oxytoca NOT DETECTED NOT DETECTED   Klebsiella pneumoniae NOT DETECTED NOT DETECTED   Proteus species DETECTED (A) NOT DETECTED   Serratia marcescens NOT DETECTED NOT DETECTED   Carbapenem resistance NOT DETECTED NOT DETECTED   Haemophilus influenzae NOT DETECTED NOT DETECTED   Neisseria meningitidis NOT DETECTED NOT DETECTED   Pseudomonas aeruginosa NOT DETECTED NOT DETECTED   Candida albicans NOT DETECTED NOT DETECTED   Candida glabrata NOT DETECTED NOT DETECTED   Candida krusei NOT DETECTED NOT DETECTED   Candida parapsilosis NOT DETECTED NOT DETECTED   Candida tropicalis NOT DETECTED NOT DETECTED   Barth Kirks, PharmD, BCPS, BCCCP Clinical  Pharmacist [phone]  Please check AMION for all [place] numbers  06/11/2019 7:07 PM

## 2019-06-11 NOTE — Progress Notes (Addendum)
PROGRESS NOTE  Roy Crawford [id number] DOB: Jul 30, 1955 DOA: 06/09/2019 PCP: [name], MD   LOS: 2 days   Brief narrative:  As per HPI,  Roy Crawford is a 64 y.o. male with medical history significant for cerebral palsy with Intellectual disability, quadriplegia and tracheostomy dependency, chronic contractures, epilepsy and recent refractory pseudomonas pneumonia requiring baseline 8L via trach and outpatient IV antibiotics, resident at [facility]. Reportedly sent over for possible worsening ulcer. Patient is nonverbal at baseline and unable to provide history.   In the ED: He was febrile up to 100.4, tachypneic at baseline 8L, 35% FiO2 through tracheostomy collar. WBC of 11.7, hemoglobin of 7.4 down from 8.5, platelet of 411. Lactate of 4.6. Na of 155, glucose of 127, AST of 114, ALT of 52, alkaline phosphatase of 1196. UA with positive nitrate and large leukocytes and many bacteria with budding yeast. He was given Tylenol suppository, 3L of LR fluid resuscitation, and started on vancomycin and cefepime.  Assessment/Plan:  Principal Problem:   Sepsis ([place]) Active Problems:   Tracheostomy dependent (HCC)   Epilepsy (HCC)   Cerebral palsy, quadriplegic (HCC)   Pressure ulcer of right buttock   Hypernatremia   Anemia   Thrombocytosis (HCC)   Essential hypertension   PEG (percutaneous endoscopic gastrostomy) status (HCC)   Transaminitis  Sepsis secondary to UTI with chronic indwelling foley catheter/cellulitis/PEG tube infection Urinalysis also showed some budding yeast.  Purulent discharge from the PEG tube as per admitting physician.  Continue IV vancomycin and Cefepime. Pt was already on IV Vancomycin for unknown indication. He was previously only discharged on IV cefepime for 2 weeks in October for pseudomonas pneumonia. 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  Wound) WBC gram-negative rods  in the wound culture- although does not appear infected. No definitive osteomyelitis seen on imaging  Posterior auricle wound bilaterally Wound care for  Hypernatremia  Na of 155 on presentation. Sodium of 145 at this time.on D5 water. Continue tube water flushes.  Chronic anemia  hemoglobin of 6.9 down from 8.5.  Transfused 1 unit of packed RBC 06/10/2019 with hemoglobin of 8.0 today  Mild thrombocytosis  Reactive. Follow with CBC.   Transaminitis/elevated alkaline phosphatase  AST of 114, ALT of 52, alkaline phosphatase of 1196.  Would be from sepsis.  Trend LFTs.AST of 76 and ALT 30 today.  Cerebral Palsy with Intellectual disability Patient is nonverbal and contracted at baseline.  Epilepsy Continue Keppra, Valproic acid  HTN  Cardizem, Lopressor is on hold given borderline BP.  Will resume. Hold Lasix  DM continue with sliding scale insulin Accu-Cheks.  Monitor blood glucose levels.  Tube feeding  Continue  VTE Prophylaxis: SCD  Code Status: DNR.    Family Communication: I had a prolonged discussion with the patient's nephew who is the medical decision-maker for the patient yesterday and also today.  Spoke about hospice level of care due to his extensive comorbid conditions and worsening quality of life.  Social Services have been consulted for hospice discussion.   Patient is  status post trach PEG with recurrent infection and stage IV sacral ulcer with gross contractures.  Overall prognosis of the patient is poor with chances of aspiration pneumonia, recurrent infections.  Patient recently had resistant pseudomonal infection.  Disposition Plan: Patient is from [facility] to skilled nursing facility.   social worker consulted for hospice discussion/arrangement.   Consultants:  Palliative care  Procedures:  None  Antibiotics: . Vancomycin, cefepime, fluconazole 06/09/2019>  Anti-infectives (From admission, onward)   Start     Dose/Rate Route Frequency  Ordered Stop   06/10/19 0230  ceFEPIme (MAXIPIME) 2 g in sodium chloride 0.9 % 100 mL IVPB     2 g 200 mL/hr over 30 Minutes Intravenous Every 8 hours 06/09/19 1942     06/09/19 2045  fluconazole (DIFLUCAN) tablet 200 mg     200 mg Oral Daily 06/09/19 2040     06/09/19 1945  vancomycin (VANCOREADY) IVPB 1250 mg/250 mL     1,250 mg 166.7 mL/hr over 90 Minutes Intravenous Every 24 hours 06/09/19 1942     06/09/19 1815  ceFEPIme (MAXIPIME) 2 g in sodium chloride 0.9 % 100 mL IVPB     2 g 200 mL/hr over 30 Minutes Intravenous  Once 06/09/19 1815 06/09/19 1910     Subjective: Today, not much has changed.  Patient is nonverbal.  History of cerebral palsy and mental retardation.  Objective: Vitals:   06/11/19 0500 06/11/19 0600  BP: (!) 111/52 (!) 107/52  Pulse: (!) 106 (!) 104  Resp: (!) 27 (!) 27  Temp:  99.7 F (37.6 C)  SpO2: 95% 96%    Intake/Output Summary (Last 24 hours) at 06/11/2019 0737 Last data filed at 06/11/2019 0442 Gross per 24 hour  Intake 3273.7 ml  Output 1000 ml  Net 2273.7 ml   Filed Weights   06/09/19 1300 06/10/19 0515 06/11/19 0200  Weight: 84 kg 79 kg 80.8 kg   Body mass index is 38.55 kg/m.   Physical Exam: GENERAL: Patient is nonverbal. Not in obvious distress. HENT: No scleral pallor or icterus. Pupils equally reactive to light. Oral mucosa is moist, auricles with wound NECK: is supple, tracheostomy collar in place, 5 L of oxygen CHEST: Diminished breath sounds bilaterally coarse breath sounds CVS: S1 and S2 heard, no murmur. Regular rate and rhythm.  ABDOMEN: Soft, non-tender, bowel sounds are present.  PEG tube in place.  Chronic indwelling Foley catheter EXTREMITIES: Contractures of 4 extremities CNS: Nonverbal, contractures of the extremities.  Does not follow command. SKIN: warm and dry  Data Review: I have personally reviewed the following laboratory data and studies,  CBC: Recent Labs  Lab 06/09/19 1710 06/10/19 0250 06/10/19 1859  06/11/19 0402  WBC 11.7* 11.2*  --  8.3  NEUTROABS 8.8*  --   --   --   HGB 7.4* 6.9* 6.9* 8.0*  HCT 24.8* 22.3* 21.7* 25.5*  MCV 85.2 83.5  --  86.1  PLT 411* 398  --  [id number]   Basic Metabolic Panel: Recent Labs  Lab 06/09/19 1710 06/09/19 2123 06/10/19 0250 06/10/19 0816  NA 155* 155* 154* 153*  K 3.8 3.6 3.7 3.2*  CL 105 106 109 108  CO2 38* 36* 37* 38*  GLUCOSE 127* 101* 89 133*  BUN 47* 42* 37* 38*  CREATININE 0.80 0.75 0.70 0.74  CALCIUM 9.7 9.3 9.3 8.9   Liver Function Tests: Recent Labs  Lab 06/09/19 1710 06/10/19 0250  AST 114* 80*  ALT 52* 41  ALKPHOS 1,195* 995*  BILITOT 0.5 0.8  PROT 6.6 6.0*  ALBUMIN 1.2* 1.1*   No results for input(s): LIPASE, AMYLASE in the last 168 hours. No results for input(s): AMMONIA in the last 168 hours. Cardiac Enzymes: No results for input(s): CKTOTAL, CKMB, CKMBINDEX, TROPONINI in the last 168 hours. BNP (last 3 results) Recent Labs    03/05/19 1830  BNP 655.1*  ProBNP (last 3 results) No results for input(s): PROBNP in the last 8760 hours.  CBG: Recent Labs  Lab 06/10/19 1711 06/10/19 2110 06/11/19 0123 06/11/19 0437 06/11/19 0636  GLUCAP 149* 137* 189* 143* 156*   Recent Results (from the past 240 hour(s))  Blood Culture (routine x 2)     Status: None (Preliminary result)   Collection Time: 06/09/19  5:00 PM   Specimen: BLOOD  Result Value Ref Range Status   Specimen Description BLOOD RIGHT WRIST  Final   Special Requests   Final    BOTTLES DRAWN AEROBIC AND ANAEROBIC Blood Culture results may not be optimal due to an inadequate volume of blood received in culture bottles   Culture   Final    NO GROWTH < 24 HOURS Performed at [facility], [place] [address] [place], [place] [address]    Report Status PENDING  Incomplete  Blood Culture (routine x 2)     Status: None (Preliminary result)   Collection Time: 06/09/19  5:05 PM   Specimen: BLOOD RIGHT HAND  Result Value Ref Range Status   Specimen  Description BLOOD RIGHT HAND  Final   Special Requests   Final    BOTTLES DRAWN AEROBIC AND ANAEROBIC Blood Culture results may not be optimal due to an inadequate volume of blood received in culture bottles   Culture   Final    NO GROWTH < 24 HOURS Performed at [facility], [place] [address] [place], [place] [address]    Report Status PENDING  Incomplete  Urine culture     Status: Abnormal   Collection Time: 06/09/19  5:25 PM   Specimen: Urine, Catheterized  Result Value Ref Range Status   Specimen Description URINE, CATHETERIZED  Final   Special Requests   Final    NONE Performed at [facility], [place] [address] [place], [place] [address]    Culture >=100,000 COLONIES/mL PSEUDOMONAS AERUGINOSA (A)  Final   Report Status 06/11/2019 FINAL  Final   Organism ID, Bacteria PSEUDOMONAS AERUGINOSA (A)  Final      Susceptibility   Pseudomonas aeruginosa - MIC*    CEFTAZIDIME 2 SENSITIVE Sensitive     CIPROFLOXACIN 2 INTERMEDIATE Intermediate     GENTAMICIN >=16 RESISTANT Resistant     IMIPENEM 2 SENSITIVE Sensitive     PIP/TAZO 8 SENSITIVE Sensitive     CEFEPIME 2 SENSITIVE Sensitive     * >=100,000 COLONIES/mL PSEUDOMONAS AERUGINOSA  Aerobic Culture (superficial specimen)     Status: None (Preliminary result)   Collection Time: 06/09/19  8:16 PM   Specimen: Ulcer  Result Value Ref Range Status   Specimen Description ULCER RIGHT BUTTOCKS  Final   Special Requests NONE  Final   Gram Stain   Final    ABUNDANT WBC PRESENT, PREDOMINANTLY PMN ABUNDANT GRAM NEGATIVE RODS RARE GRAM POSITIVE COCCI    Culture   Final    ABUNDANT GRAM NEGATIVE RODS CULTURE REINCUBATED FOR BETTER GROWTH Performed at [facility], [address], [place], [place] [address]    Report Status PENDING  Incomplete  Nasopharyngeal Culture     Status: None (Preliminary result)   Collection Time: 06/09/19  9:01 PM   Specimen: Nasal Mucosa  Result Value Ref Range Status   Specimen Description  NASOPHARYNGEAL  Final   Special Requests NONE  Final   Culture   Final    CULTURE REINCUBATED FOR BETTER GROWTH Performed at [facility], [address] [place], [place] [address]    Report Status PENDING  Incomplete  SARS CORONAVIRUS 2 (TAT 6-24 HRS) Nasopharyngeal Nasopharyngeal Swab     Status: None   Collection Time: 06/09/19  9:14 PM   Specimen: Nasopharyngeal Swab  Result Value Ref Range Status   SARS Coronavirus 2 NEGATIVE NEGATIVE Final    Comment: (NOTE) SARS-CoV-2 target nucleic acids are NOT DETECTED. The SARS-CoV-2 RNA is generally detectable in upper and lower respiratory specimens during the acute phase of infection. Negative results do not preclude SARS-CoV-2 infection, do not rule out co-infections with other pathogens, and should not be used as the sole basis for treatment or other patient management decisions. Negative results must be combined with clinical observations, patient history, and epidemiological information. The expected result is Negative. Fact Sheet for Patients: SugarRoll.be Fact Sheet for Healthcare Providers: https://www.woods-mathews.com/ This test is not yet approved or cleared by the [place] FDA and  has been authorized for detection and/or diagnosis of SARS-CoV-2 by FDA under an Emergency Use Authorization (EUA). This EUA will remain  in effect (meaning this test can be used) for the duration of the COVID-19 declaration under Section 56 4(b)(1) of the Act, 21 U.S.C. section 360bbb-3(b)(1), unless the authorization is terminated or revoked sooner. Performed at [facility], [place] [address], [place], [place] [address]   MRSA PCR Screening     Status: Abnormal   Collection Time: 06/10/19  5:14 AM   Specimen: Nasal Mucosa; Nasopharyngeal  Result Value Ref Range Status   MRSA by PCR POSITIVE (A) NEGATIVE Final    Comment:        The GeneXpert MRSA Assay (FDA approved for NASAL  specimens only), is one component of a comprehensive MRSA colonization surveillance program. It is not intended to diagnose MRSA infection nor to guide or monitor treatment for MRSA infections. RESULT CALLED TO, READ BACK BY AND VERIFIED WITH: RN EU, T. [phone] FCP Performed at [facility], [place] [address] [place], [place] [address]      Studies: DG Chest Port 1 View  Result Date: 06/09/2019 CLINICAL DATA:  Tachypnea.  Sacral pressure ulcer. EXAM: PORTABLE CHEST 1 VIEW COMPARISON:  03/05/2019 FINDINGS: Cardiac silhouette is mildly enlarged. No mediastinal or hilar masses. There are prominent bronchovascular markings. Interstitial and hazy airspace opacities are noted in the left lung centrally. No convincing pleural effusion.  No pneumothorax. Right PICC tip projects in the mid superior vena cava. Tracheostomy tube tip projects in the upper thoracic trachea. Skeletal structures show a marked thoracic dextroscoliosis. IMPRESSION: 1. Prominent bronchovascular markings with left lung interstitial and hazy intervening airspace opacities. Findings on the left may be due to bronchovascular crowding and atelectasis. Consider infection there are consistent clinical findings. No over pulmonary edema. 2. Stable mild cardiomegaly. 3. Dextroscoliosis. Electronically Signed   By: Lajean Manes M.D.   On: 06/09/2019 14:25   DG Hip Unilat W or Wo Pelvis 2-3 Views Right  Result Date: 06/09/2019 CLINICAL DATA:  Stage IV pressure ulcer. Concern for osteomyelitis right ischium. EXAM: DG HIP (WITH OR WITHOUT PELVIS) 2-3V RIGHT COMPARISON:  None. FINDINGS: There is moderate diffuse osteopenia. There is no discernible left femoral head or neck as there is superior subluxation of the adjacent trochanteric portion of the left femur likely chronic deformity. Severe degenerative changes of the right hip with irregular appearance of the femoral head. Right hip joint is not well-defined as there may be partial  chronic ankylosis. There is a  6.6 cm lucent defect overlying the right proximal femur likely representing air in the superimposed soft tissues compatible patient's known soft tissue ulcer. There is no focal lytic process over the right ischium. Chronic infection of the right hip joint is possible. There are degenerative changes of the spine. IMPRESSION: 1. 6.6 cm air collection within the soft tissues overlying the right hip likely involving the posterior tissues compatible patient's known soft tissue ulceration. No definite bone destruction of the adjacent right ischium. Associated chronic deformity of the right hip joint and femoral head as chronic infection is possible. 2.  Left hip deformity as described suggesting chronic deformity. Electronically Signed   By: Marin Olp M.D.   On: 06/09/2019 16:05     Flora Lipps, MD  [facility] 06/11/2019

## 2019-06-11 NOTE — Progress Notes (Signed)
Pt has low BP 90/50, RR> 30, light fever 99.7 F. Notified Dr Louanne Belton, and received new order. Continue monitor pt.

## 2019-06-11 NOTE — Progress Notes (Addendum)
   06/11/19 1418  MEWS Assessment  Is this an acute change? Yes  Provider Notification  Provider Name/Title Flora Lipps RN  Date Provider Notified 06/11/19  Time Provider Notified 1400  Notification Type Page  Notification Reason Change in status  Response See new orders  Date of Provider Response 06/11/19  Time of Provider Response 1403   Mews score of 4, Sustained RR > 30. DR Pokhrel is aware.

## 2019-06-11 NOTE — Consult Note (Signed)
Palliative Care Consult Note  Reason for Consult: Goals of care in light of recurrent hospitalizations/infections  Palliative care consult received.  Chart reviewed including personal review of pertinent labs and imaging.  Discussed with bedside RN.  Briefly, Roy Crawford is a 64 yo male with PMHx cerebral palsy, intellectual disability, quadraplegia, tracheostomy, long term care resident at [facility] with recent refractory pseudomonas PNA admitted with concern of worsening ulcer.  Palliative consulted for Roy Crawford.  I called and spoke with Roy Crawford nephew/guardian, Roy Crawford. We discussed clinical course as well as wishes moving forward in regard to advanced directives.  Concepts specific to code status and care plan this hospitalization discussed.  We discussed difference between a aggressive medical intervention path and a palliative, comfort focused care path.   Concept of Hospice and Palliative Care were discussed  - Continue current interventions for another 24 hours. If his condition declines, will likely work to transition to full comfort.  Questions and concerns addressed.   PMT will continue to support holistically.  Time start: 1750 Time end: 1850 Total time: 60 minute  Greater than 50%  of this time was spent counseling and coordinating care related to the above assessment and plan.  Roy Rough, MD [place] Team [phone]

## 2019-06-12 LAB — GLUCOSE, CAPILLARY
Glucose-Capillary: 127 mg/dL — ABNORMAL HIGH (ref 70–99)
Glucose-Capillary: 137 mg/dL — ABNORMAL HIGH (ref 70–99)
Glucose-Capillary: 139 mg/dL — ABNORMAL HIGH (ref 70–99)
Glucose-Capillary: 140 mg/dL — ABNORMAL HIGH (ref 70–99)
Glucose-Capillary: 156 mg/dL — ABNORMAL HIGH (ref 70–99)
Glucose-Capillary: 165 mg/dL — ABNORMAL HIGH (ref 70–99)

## 2019-06-12 LAB — NASOPHARYNGEAL CULTURE: Culture: NOT DETECTED

## 2019-06-12 LAB — BASIC METABOLIC PANEL
Anion gap: 7 (ref 5–15)
BUN: 27 mg/dL — ABNORMAL HIGH (ref 8–23)
CO2: 29 mmol/L (ref 22–32)
Calcium: 7.5 mg/dL — ABNORMAL LOW (ref 8.9–10.3)
Chloride: 108 mmol/L (ref 98–111)
Creatinine, Ser: 0.66 mg/dL (ref 0.61–1.24)
GFR calc Af Amer: >60 mL/min (ref >60)
GFR calc non Af Amer: >60 mL/min (ref >60)
Glucose, Bld: 154 mg/dL — ABNORMAL HIGH (ref 70–99)
Potassium: 3.3 mmol/L — ABNORMAL LOW (ref 3.5–5.1)
Sodium: 144 mmol/L (ref 135–145)

## 2019-06-12 LAB — CBC
HCT: 26.4 % — ABNORMAL LOW (ref 39.0–52.0)
Hemoglobin: 8.1 g/dL — ABNORMAL LOW (ref 13.0–17.0)
MCH: 27.1 pg (ref 26.0–34.0)
MCHC: 30.7 g/dL (ref 30.0–36.0)
MCV: 88.3 fL (ref 80.0–100.0)
Platelets: 277 10*3/uL (ref 150–400)
RBC: 2.99 MIL/uL — ABNORMAL LOW (ref 4.22–5.81)
RDW: 18.6 % — ABNORMAL HIGH (ref 11.5–15.5)
WBC: 9.9 10*3/uL (ref 4.0–10.5)
nRBC: 0.9 % — ABNORMAL HIGH (ref 0.0–0.2)

## 2019-06-12 LAB — MAGNESIUM: Magnesium: 1.5 mg/dL — ABNORMAL LOW (ref 1.7–2.4)

## 2019-06-12 MED ORDER — POTASSIUM CHLORIDE 10 MEQ/100ML IV SOLN
10.0000 meq | INTRAVENOUS | Status: AC
  Administered 2019-06-12 (×4): 10 meq via INTRAVENOUS
  Filled 2019-06-12 (×4): qty 100

## 2019-06-12 MED ORDER — MAGNESIUM SULFATE 2 GM/50ML IV SOLN
2.0000 g | Freq: Once | INTRAVENOUS | Status: AC
  Administered 2019-06-12: 2 g via INTRAVENOUS
  Filled 2019-06-12: qty 50

## 2019-06-12 NOTE — Progress Notes (Addendum)
PROGRESS NOTE  Roy Crawford [id number] DOB: 02/05/1956 DOA: 06/09/2019 PCP: [name], MD   LOS: 3 days   Brief narrative:  As per HPI,  Roy Crawford is a 64 y.o. male with medical history significant for cerebral palsy with Intellectual disability, quadriplegia and tracheostomy dependency, chronic contractures, epilepsy and recent refractory pseudomonas pneumonia requiring baseline 8L via trach and outpatient IV antibiotics, resident at [facility]. Reportedly sent over for possible worsening ulcer. Patient is nonverbal at baseline and unable to provide history.   In the ED: He was febrile up to 100.4, tachypneic at baseline 8L, 35% FiO2 through tracheostomy collar. WBC of 11.7, hemoglobin of 7.4 down from 8.5, platelet of 411. Lactate of 4.6. Na of 155, glucose of 127, AST of 114, ALT of 52, alkaline phosphatase of 1196. UA with positive nitrate and large leukocytes and many bacteria with budding yeast. He was given Tylenol suppository, 3L of LR fluid resuscitation, and started on vancomycin and cefepime.  Assessment/Plan:  Principal Problem:   Sepsis ([place]) Active Problems:   Tracheostomy dependent (HCC)   Epilepsy (HCC)   Cerebral palsy, quadriplegic (HCC)   Pressure ulcer of right buttock   Hypernatremia   Anemia   Thrombocytosis (HCC)   Essential hypertension   PEG (percutaneous endoscopic gastrostomy) status (HCC)   Transaminitis  Sepsis secondary to UTI with chronic indwelling foley catheter/cellulitis at PEG tube site Urinalysis also showed some budding yeast.  Purulent discharge from the PEG tube as per admitting physician.  Pt was already on IV Vancomycin for unknown indication. He was previously only discharged on IV cefepime for 2 weeks in October for pseudomonas pneumonia. On fluconazole for yeast seen in UA. Was on vancomycin and cefepime and this has been narrowed to cefepime.  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   Wound with WBC gram-negative rods in the wound culture- although does not appear infected. No definitive osteomyelitis seen on imaging  Posterior auricle wound bilaterally Wound care to continue  Hypernatremia  Na of 155 on presentation. Sodium of 144 at this time.  He was initially on D5 water.  Now has been changed to normal saline.  Continue  tube water flushes.  Chronic anemia  hemoglobin of 6.9 down from 8.5.  Transfused 1 unit of packed RBC 06/10/2019 with hemoglobin of 8.1 today  Mild thrombocytosis  Reactive.  Normalized at this time  Transaminitis/elevated alkaline phosphatase  AST of 114, ALT of 52, alkaline phosphatase of 1196 on presentation.  Likely from from sepsis.    Cerebral Palsy with Intellectual disability Patient is nonverbal and contracted at baseline.  Epilepsy Continue Keppra, Valproic acid  Hypokalemia and hypomagnesemia.  Will replenish IV.  HTN  Cardizem, Lopressor is on hold given borderline BP.  Will resume. Hold Lasix  DM type II continue with sliding scale insulin Accu-Cheks.  Monitor blood glucose levels.  Tube feeding  Continue as tolerated  VTE Prophylaxis:  Lovenox  Code Status: DNR.    Family Communication: None today.   Disposition Plan: Palliative care on board.  Hospice care/comfort care depending upon clinical course.  Palliative care to discuss with the family    Consultants:  Palliative care  Procedures:  None  Antibiotics: . Vancomycin, 06/09/2019> 1/29 . cefepime, fluconazole 06/09/2019>  Anti-infectives (From admission, onward)   Start     Dose/Rate Route Frequency Ordered Stop   06/10/19 0230  ceFEPIme (MAXIPIME) 2 g in sodium chloride 0.9 % 100 mL IVPB  2 g 200 mL/hr over 30 Minutes Intravenous Every 8 hours 06/09/19 1942     06/09/19 2045  fluconazole (DIFLUCAN) tablet 200 mg     200 mg Oral Daily 06/09/19 2040     06/09/19 1945  vancomycin (VANCOREADY) IVPB 1250 mg/250 mL  Status:  Discontinued      1,250 mg 166.7 mL/hr over 90 Minutes Intravenous Every 24 hours 06/09/19 1942 06/11/19 1845   06/09/19 1815  ceFEPIme (MAXIPIME) 2 g in sodium chloride 0.9 % 100 mL IVPB     2 g 200 mL/hr over 30 Minutes Intravenous  Once 06/09/19 1815 06/09/19 1910     Subjective: Today, clinical condition of the patient has not changed.  Patient nursing staff reported that he was tachypneic yesterday .  He also appeared to be anxious  Objective: Vitals:   06/12/19 0900 06/12/19 1026  BP:  (!) 142/71  Pulse: 88 99  Resp: (!) 24   Temp:    SpO2: 98%     Intake/Output Summary (Last 24 hours) at 06/12/2019 1048 Last data filed at 06/12/2019 0645 Gross per 24 hour  Intake 1618.9 ml  Output 1150 ml  Net 468.9 ml   Filed Weights   06/09/19 1300 06/10/19 0515 06/11/19 0200  Weight: 84 kg 79 kg 80.8 kg   Body mass index is 38.55 kg/m.   Physical Exam: GENERAL: Patient is nonverbal. Not in obvious distress.  Chronically ill deconditioned and frail.  Contracted HENT: No scleral pallor or icterus. Pupils equally reactive to light. Oral mucosa is moist, auricles with wound NECK: is supple, tracheostomy collar in place, 5 L of oxygen saturating 98% CHEST: Diminished breath sounds bilaterally coarse breath sounds CVS: S1 and S2 heard, no murmur. Regular rate and rhythm.  ABDOMEN: Soft, non-tender, bowel sounds are present.  PEG tube in place.  Chronic indwelling Foley catheter EXTREMITIES: Contractures of all 4 extremities CNS: Nonverbal, contractures of the extremities.  Does not follow command. SKIN: warm and dry, right buttocks with stage IV ulcer present on admission  Data Review: I have personally reviewed the following laboratory data and studies,  CBC: Recent Labs  Lab 06/09/19 1710 06/10/19 0250 06/10/19 1859 06/11/19 0402 06/12/19 0510  WBC 11.7* 11.2*  --  8.3 9.9  NEUTROABS 8.8*  --   --   --   --   HGB 7.4* 6.9* 6.9* 8.0* 8.1*  HCT 24.8* 22.3* 21.7* 25.5* 26.4*  MCV 85.2  83.5  --  86.1 88.3  PLT 411* 398  --  299 [id number]   Basic Metabolic Panel: Recent Labs  Lab 06/09/19 2123 06/10/19 0250 06/10/19 0816 06/11/19 0620 06/12/19 0510  NA 155* 154* 153* 145 144  K 3.6 3.7 3.2* 3.0* 3.3*  CL 106 109 108 105 108  CO2 36* 37* 38* 32 29  GLUCOSE 101* 89 133* 183* 154*  BUN 42* 37* 38* 30* 27*  CREATININE 0.75 0.70 0.74 0.93 0.66  CALCIUM 9.3 9.3 8.9 8.1* 7.5*  MG  --   --   --  1.7 1.5*  PHOS  --   --   --  3.6  --    Liver Function Tests: Recent Labs  Lab 06/09/19 1710 06/10/19 0250 06/11/19 0620  AST 114* 80* 76*  ALT 52* 41 30  ALKPHOS 1,195* 995* 789*  BILITOT 0.5 0.8 0.9  PROT 6.6 6.0* 5.3*  ALBUMIN 1.2* 1.1* <1.0*   No results for input(s): LIPASE, AMYLASE in the last 168 hours. No results for input(s):  AMMONIA in the last 168 hours. Cardiac Enzymes: No results for input(s): CKTOTAL, CKMB, CKMBINDEX, TROPONINI in the last 168 hours. BNP (last 3 results) Recent Labs    03/05/19 1830  BNP 655.1*    ProBNP (last 3 results) No results for input(s): PROBNP in the last 8760 hours.  CBG: Recent Labs  Lab 06/11/19 1700 06/11/19 2004 06/12/19 0005 06/12/19 0407 06/12/19 0743  GLUCAP 189* 149* 127* 137* 156*   Recent Results (from the past 240 hour(s))  Blood Culture (routine x 2)     Status: Abnormal (Preliminary result)   Collection Time: 06/09/19  5:00 PM   Specimen: BLOOD  Result Value Ref Range Status   Specimen Description BLOOD RIGHT WRIST  Final   Special Requests   Final    BOTTLES DRAWN AEROBIC AND ANAEROBIC Blood Culture results may not be optimal due to an inadequate volume of blood received in culture bottles   Culture  Setup Time   Final    GRAM NEGATIVE RODS ANAEROBIC BOTTLE ONLY CRITICAL RESULT CALLED TO, READ BACK BY AND VERIFIED WITH: Hughie Closs [facility] 06/11/19 1829 JDW Performed at [facility], [place] [address], [place], [place] [address]    Culture PROTEUS MIRABILIS (A)  Final   Report Status PENDING   Incomplete  Blood Culture ID Panel (Reflexed)     Status: Abnormal   Collection Time: 06/09/19  5:00 PM  Result Value Ref Range Status   Enterococcus species NOT DETECTED NOT DETECTED Final   Listeria monocytogenes NOT DETECTED NOT DETECTED Final   Staphylococcus species NOT DETECTED NOT DETECTED Final   Staphylococcus aureus (BCID) NOT DETECTED NOT DETECTED Final   Streptococcus species NOT DETECTED NOT DETECTED Final   Streptococcus agalactiae NOT DETECTED NOT DETECTED Final   Streptococcus pneumoniae NOT DETECTED NOT DETECTED Final   Streptococcus pyogenes NOT DETECTED NOT DETECTED Final   Acinetobacter baumannii NOT DETECTED NOT DETECTED Final   Enterobacteriaceae species DETECTED (A) NOT DETECTED Final    Comment: Enterobacteriaceae represent a large family of gram-negative bacteria, not a single organism. CRITICAL RESULT CALLED TO, READ BACK BY AND VERIFIED WITH: Hughie Closs [facility] 06/11/19 1829 JDW    Enterobacter cloacae complex NOT DETECTED NOT DETECTED Final   Escherichia coli NOT DETECTED NOT DETECTED Final   Klebsiella oxytoca NOT DETECTED NOT DETECTED Final   Klebsiella pneumoniae NOT DETECTED NOT DETECTED Final   Proteus species DETECTED (A) NOT DETECTED Final    Comment: CRITICAL RESULT CALLED TO, READ BACK BY AND VERIFIED WITH: Hughie Closs [facility] 06/11/19 1829 JDW    Serratia marcescens NOT DETECTED NOT DETECTED Final   Carbapenem resistance NOT DETECTED NOT DETECTED Final   Haemophilus influenzae NOT DETECTED NOT DETECTED Final   Neisseria meningitidis NOT DETECTED NOT DETECTED Final   Pseudomonas aeruginosa NOT DETECTED NOT DETECTED Final   Candida albicans NOT DETECTED NOT DETECTED Final   Candida glabrata NOT DETECTED NOT DETECTED Final   Candida krusei NOT DETECTED NOT DETECTED Final   Candida parapsilosis NOT DETECTED NOT DETECTED Final   Candida tropicalis NOT DETECTED NOT DETECTED Final    Comment: Performed at [facility], [place] [address] [place],  [place] [address]  Blood Culture (routine x 2)     Status: None (Preliminary result)   Collection Time: 06/09/19  5:05 PM   Specimen: BLOOD RIGHT HAND  Result Value Ref Range Status   Specimen Description BLOOD RIGHT HAND  Final   Special Requests   Final  BOTTLES DRAWN AEROBIC AND ANAEROBIC Blood Culture results may not be optimal due to an inadequate volume of blood received in culture bottles   Culture   Final    NO GROWTH 2 DAYS Performed at [facility], [place] [address] [place], [place] [address]    Report Status PENDING  Incomplete  Urine culture     Status: Abnormal   Collection Time: 06/09/19  5:25 PM   Specimen: Urine, Catheterized  Result Value Ref Range Status   Specimen Description URINE, CATHETERIZED  Final   Special Requests   Final    NONE Performed at [facility], [place] [address] [place], [place] [address]    Culture >=100,000 COLONIES/mL PSEUDOMONAS AERUGINOSA (A)  Final   Report Status 06/11/2019 FINAL  Final   Organism ID, Bacteria PSEUDOMONAS AERUGINOSA (A)  Final      Susceptibility   Pseudomonas aeruginosa - MIC*    CEFTAZIDIME 2 SENSITIVE Sensitive     CIPROFLOXACIN 2 INTERMEDIATE Intermediate     GENTAMICIN >=16 RESISTANT Resistant     IMIPENEM 2 SENSITIVE Sensitive     PIP/TAZO 8 SENSITIVE Sensitive     CEFEPIME 2 SENSITIVE Sensitive     * >=100,000 COLONIES/mL PSEUDOMONAS AERUGINOSA  Aerobic Culture (superficial specimen)     Status: None (Preliminary result)   Collection Time: 06/09/19  8:16 PM   Specimen: Ulcer  Result Value Ref Range Status   Specimen Description ULCER RIGHT BUTTOCKS  Final   Special Requests NONE  Final   Gram Stain   Final    ABUNDANT WBC PRESENT, PREDOMINANTLY PMN ABUNDANT GRAM NEGATIVE RODS RARE GRAM POSITIVE COCCI    Culture   Final    ABUNDANT GRAM NEGATIVE RODS ABUNDANT PROTEUS MIRABILIS IDENTIFICATION AND SUSCEPTIBILITIES TO FOLLOW CULTURE REINCUBATED FOR BETTER GROWTH Performed at [facility],  [address], [place], [place] [address]    Report Status PENDING  Incomplete  Nasopharyngeal Culture     Status: None (Preliminary result)   Collection Time: 06/09/19  9:01 PM   Specimen: Nasal Mucosa  Result Value Ref Range Status   Specimen Description NASOPHARYNGEAL  Final   Special Requests NONE  Final   Culture   Final    CULTURE REINCUBATED FOR BETTER GROWTH Performed at [facility], [address], [place],  [address]    Report Status PENDING  Incomplete  SARS CORONAVIRUS 2 (TAT 6-24 HRS) Nasopharyngeal Nasopharyngeal Swab     Status: None   Collection Time: 06/09/19  9:14 PM   Specimen: Nasopharyngeal Swab  Result Value Ref Range Status   SARS Coronavirus 2 NEGATIVE NEGATIVE Final    Comment: (NOTE) SARS-CoV-2 target nucleic acids are NOT DETECTED. The SARS-CoV-2 RNA is generally detectable in upper and lower respiratory specimens during the acute phase of infection. Negative results do not preclude SARS-CoV-2 infection, do not rule out co-infections with other pathogens, and should not be used as the sole basis for treatment or other patient management decisions. Negative results must be combined with clinical observations, patient history, and epidemiological information. The expected result is Negative. Fact Sheet for Patients: SugarRoll.be Fact Sheet for Healthcare Providers: https://www.woods-mathews.com/ This test is not yet approved or cleared by the [place] FDA and  has been authorized for detection and/or diagnosis of SARS-CoV-2 by FDA under an Emergency Use Authorization (EUA). This EUA will remain  in effect (meaning this test can be used) for the duration of the COVID-19 declaration under Section 56  4(b)(1) of the Act, 21 U.S.C. section 360bbb-3(b)(1), unless the authorization is terminated or revoked sooner. Performed at [facility], [place] [address], [place], [place] [address]   MRSA PCR  Screening     Status: Abnormal   Collection Time: 06/10/19  5:14 AM   Specimen: Nasal Mucosa; Nasopharyngeal  Result Value Ref Range Status   MRSA by PCR POSITIVE (A) NEGATIVE Final    Comment:        The GeneXpert MRSA Assay (FDA approved for NASAL specimens only), is one component of a comprehensive MRSA colonization surveillance program. It is not intended to diagnose MRSA infection nor to guide or monitor treatment for MRSA infections. RESULT CALLED TO, READ BACK BY AND VERIFIED WITH: RN EU, T. [phone] FCP Performed at [facility], [place] [address] [place], [place] [address]      Studies: No results found.   Flora Lipps, MD  [facility] 06/12/2019

## 2019-06-12 NOTE — Progress Notes (Signed)
Palliative care progress note  Reason for Consult: Goals of care in light of recurrent hospitalizations/infections  Events of last 24 hours noted.  Chart reviewed including personal review of pertinent labs and imaging.  Discussed with bedside RN.  Roy Crawford has had no significant change since evaluation yesterday.  Attempted to reach out to his nephew, but did not reach him today.  Left voicemail with number for return call.  Otherwise, will attempt to reach out again tomorrow.  - Continue current interventions. If his condition declines, will likely work to transition to full comfort. - Will reach out to family again tomorrow.  Total time: 15 minutes  Greater than 50% of this time was spent counseling and coordinating care related to the above assessment and plan.  Micheline Rough, MD [facility] [phone]

## 2019-06-12 NOTE — Progress Notes (Signed)
Notified of increased QTc. Patient assessed and charge nurse made aware. MD paged of change in QTc

## 2019-06-12 NOTE — Progress Notes (Signed)
CCMD called to notify of increased QTc. Patient assessed and EKG completed at bedside. Patient does not appear to have any increased signs of distress. MD paged to notify of findings

## 2019-06-13 LAB — GLUCOSE, CAPILLARY
Glucose-Capillary: 164 mg/dL — ABNORMAL HIGH (ref 70–99)
Glucose-Capillary: 166 mg/dL — ABNORMAL HIGH (ref 70–99)
Glucose-Capillary: 176 mg/dL — ABNORMAL HIGH (ref 70–99)
Glucose-Capillary: 192 mg/dL — ABNORMAL HIGH (ref 70–99)
Glucose-Capillary: 203 mg/dL — ABNORMAL HIGH (ref 70–99)
Glucose-Capillary: 206 mg/dL — ABNORMAL HIGH (ref 70–99)
Glucose-Capillary: 210 mg/dL — ABNORMAL HIGH (ref 70–99)

## 2019-06-13 LAB — AEROBIC CULTURE W GRAM STAIN (SUPERFICIAL SPECIMEN)

## 2019-06-13 LAB — BASIC METABOLIC PANEL
Anion gap: 8 (ref 5–15)
BUN: 23 mg/dL (ref 8–23)
CO2: 29 mmol/L (ref 22–32)
Calcium: 8.3 mg/dL — ABNORMAL LOW (ref 8.9–10.3)
Chloride: 108 mmol/L (ref 98–111)
Creatinine, Ser: 0.73 mg/dL (ref 0.61–1.24)
GFR calc Af Amer: >60 mL/min (ref >60)
GFR calc non Af Amer: >60 mL/min (ref >60)
Glucose, Bld: 226 mg/dL — ABNORMAL HIGH (ref 70–99)
Potassium: 3.9 mmol/L (ref 3.5–5.1)
Sodium: 145 mmol/L (ref 135–145)

## 2019-06-13 LAB — CULTURE, BLOOD (ROUTINE X 2)

## 2019-06-13 LAB — MAGNESIUM: Magnesium: 2.1 mg/dL (ref 1.7–2.4)

## 2019-06-13 NOTE — Progress Notes (Addendum)
PROGRESS NOTE  Roy Crawford [id number] DOB: 08-22-1955 DOA: 06/09/2019 PCP: [name], MD   LOS: 4 days   Brief narrative:  As per HPI,  Roy Crawford is a 64 y.o. male with medical history significant for cerebral palsy with Intellectual disability, quadriplegia and tracheostomy dependency, chronic contractures, epilepsy and recent refractory pseudomonas pneumonia requiring baseline 8L via trach and outpatient IV antibiotics, resident at [facility]. Reportedly sent over for possible worsening ulcer. Patient is nonverbal at baseline and unable to provide history.   In the ED: He was febrile up to 100.4, tachypneic at baseline 8L, 35% FiO2 through tracheostomy collar. WBC of 11.7, hemoglobin of 7.4 down from 8.5, platelet of 411. Lactate of 4.6. Na of 155, glucose of 127, AST of 114, ALT of 52, alkaline phosphatase of 1196. UA with positive nitrate and large leukocytes and many bacteria with budding yeast. He was given Tylenol suppository, 3L of LR fluid resuscitation, and started on vancomycin and cefepime.  Assessment/Plan:  Principal Problem:   Sepsis ([place]) Active Problems:   Tracheostomy dependent (HCC)   Epilepsy (HCC)   Cerebral palsy, quadriplegic (HCC)   Pressure ulcer of right buttock   Hypernatremia   Anemia   Thrombocytosis (HCC)   Essential hypertension   PEG (percutaneous endoscopic gastrostomy) status (HCC)   Transaminitis  Sepsis secondary to UTI with chronic indwelling foley catheter/cellulitis at PEG tube site Urinalysis also showed some budding yeast.  was on IV Vancomycin for unknown indication. He was previously only discharged on IV cefepime for 2 weeks in October for pseudomonas pneumonia. Continue fluconazole.Currently on cefepime.  Blood culture showing Proteus mirabilis.  Local wound culture from the right buttocks wound showing Proteus mirabilis as well.  Continue cefepime.  Chronic respiratory failure status post tracheostomy.   Currently on 5 L/min saturating 99%.  Proteus sepsis.  On cefepime.  We will continue with that.  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  Wound with WBC gram-negative rods in the wound culture- although does not appear infected. No definitive osteomyelitis seen on imaging  Posterior auricle wound bilaterally Wound care to continue  Hypernatremia  Na of 155 on presentation. Sodium of 145 today.  He was initially on D5 water.   Continue  tube water flushes.  Chronic anemia   Transfused 1 unit of packed RBC 06/10/2019.  No external bleeding.  Closely monitor hemoglobin levels.  Mild thrombocytosis  Reactive.  Normalized at this time  Transaminitis/elevated alkaline phosphatase  AST of 114, ALT of 52, alkaline phosphatase of 1196 on presentation.  Likely from from sepsis.    Cerebral Palsy with Intellectual disability Patient is nonverbal and contracted at baseline.  Epilepsy Continue Keppra, Valproic acid  Hypokalemia and hypomagnesemia.  Replenished IV .  Magnesium of 0.1 today and potassium 3.9.  HTN Was on Cardizem, Lopressor PO.  Metoprolol p.o. and Cardizem was hold due to prolonged QTC.  On as needed metoprolol only for tachycardia.   DM type II continue with sliding scale insulin Accu-Cheks.  Monitor blood glucose levels.  Tube feeding  Continue as tolerated  VTE Prophylaxis:  Lovenox sub q  Code Status: DNR.    Family Communication: I tried to call the patient's nephew but was unable to reach him  Disposition Plan: Palliative care on board.  Hospice care/comfort care depending upon clinical course.    Consultants:  Palliative care  Procedures:  None  Antibiotics: . Vancomycin, 06/09/2019> 1/29 . Cefepime 06/09/2019> . Fluconazole 06/09/2019>  Anti-infectives (From admission, onward)   Start     Dose/Rate Route Frequency Ordered Stop   06/10/19 0230  ceFEPIme (MAXIPIME) 2 g in sodium chloride 0.9 % 100 mL IVPB      2 g 200 mL/hr over 30 Minutes Intravenous Every 8 hours 06/09/19 1942     06/09/19 2045  fluconazole (DIFLUCAN) tablet 200 mg     200 mg Oral Daily 06/09/19 2040     06/09/19 1945  vancomycin (VANCOREADY) IVPB 1250 mg/250 mL  Status:  Discontinued     1,250 mg 166.7 mL/hr over 90 Minutes Intravenous Every 24 hours 06/09/19 1942 06/11/19 1845   06/09/19 1815  ceFEPIme (MAXIPIME) 2 g in sodium chloride 0.9 % 100 mL IVPB     2 g 200 mL/hr over 30 Minutes Intravenous  Once 06/09/19 1815 06/09/19 1910     Subjective: Today, no interval complaints reported.  No change in the clinical condition of the patient.  Medications were adjusted for prolonged QTC yesterday.  Objective: Vitals:   06/13/19 0600 06/13/19 0656  BP: 131/60 (!) 90/58  Pulse: 86 91  Resp: (!) 22 (!) 24  Temp:    SpO2: 99% 97%    Intake/Output Summary (Last 24 hours) at 06/13/2019 0745 Last data filed at 06/13/2019 0400 Gross per 24 hour  Intake 3148.39 ml  Output 1725 ml  Net 1423.39 ml   Filed Weights   06/10/19 0515 06/11/19 0200 06/13/19 0408  Weight: 79 kg 80.8 kg 82.1 kg   Body mass index is 39.17 kg/m.   Physical Exam: General: Patient is nonverbal, chronically ill, deconditioned and frail.  Contracted extremities. HENT: Normocephalic, pupils equally reacting to light, auricles with wound.  Tracheostomy in place. Chest:   Diminished breath sounds bilaterally.  Coarse breath sounds. CVS: S1 &S2 heard. No murmur.  Regular rate and rhythm. Abdomen: Soft, nontender, nondistended.  PEG tube in place.  Bowel sounds are heard.  Extremities: Contraction of all extremities. Psych: Nonverbal. CNS: Nonverbal, contracted.  Does not follow command.  Deficits noted.  No cerebellar signs.   Skin: Warm and dry.  Right buttocks with a stage IV ulcer present on admission   Data Review: I have personally reviewed the following laboratory data and studies,  CBC: Recent Labs  Lab 06/09/19 1710 06/10/19 0250  06/10/19 1859 06/11/19 0402 06/12/19 0510  WBC 11.7* 11.2*  --  8.3 9.9  NEUTROABS 8.8*  --   --   --   --   HGB 7.4* 6.9* 6.9* 8.0* 8.1*  HCT 24.8* 22.3* 21.7* 25.5* 26.4*  MCV 85.2 83.5  --  86.1 88.3  PLT 411* 398  --  299 [id number]   Basic Metabolic Panel: Recent Labs  Lab 06/10/19 0250 06/10/19 0816 06/11/19 0620 06/12/19 0510 06/13/19 0407  NA 154* 153* 145 144 145  K 3.7 3.2* 3.0* 3.3* 3.9  CL 109 108 105 108 108  CO2 37* 38* 32 29 29  GLUCOSE 89 133* 183* 154* 226*  BUN 37* 38* 30* 27* 23  CREATININE 0.70 0.74 0.93 0.66 0.73  CALCIUM 9.3 8.9 8.1* 7.5* 8.3*  MG  --   --  1.7 1.5* 2.1  PHOS  --   --  3.6  --   --    Liver Function Tests: Recent Labs  Lab 06/09/19 1710 06/10/19 0250 06/11/19 0620  AST 114* 80* 76*  ALT 52* 41 30  ALKPHOS 1,195* 995* 789*  BILITOT 0.5 0.8 0.9  PROT 6.6 6.0* 5.3*  ALBUMIN 1.2* 1.1* <1.0*   No results for input(s): LIPASE, AMYLASE in the last 168 hours. No results for input(s): AMMONIA in the last 168 hours. Cardiac Enzymes: No results for input(s): CKTOTAL, CKMB, CKMBINDEX, TROPONINI in the last 168 hours. BNP (last 3 results) Recent Labs    03/05/19 1830  BNP 655.1*    ProBNP (last 3 results) No results for input(s): PROBNP in the last 8760 hours.  CBG: Recent Labs  Lab 06/12/19 1639 06/12/19 1938 06/13/19 0014 06/13/19 0412 06/13/19 0602  GLUCAP 139* 140* 203* 206* 164*   Recent Results (from the past 240 hour(s))  Blood Culture (routine x 2)     Status: Abnormal (Preliminary result)   Collection Time: 06/09/19  5:00 PM   Specimen: BLOOD  Result Value Ref Range Status   Specimen Description BLOOD RIGHT WRIST  Final   Special Requests   Final    BOTTLES DRAWN AEROBIC AND ANAEROBIC Blood Culture results may not be optimal due to an inadequate volume of blood received in culture bottles   Culture  Setup Time   Final    GRAM NEGATIVE RODS ANAEROBIC BOTTLE ONLY CRITICAL RESULT CALLED TO, READ BACK BY AND  VERIFIED WITH: Hughie Closs [facility] 06/11/19 1829 JDW Performed at [facility], [place] [address], [place], [place] [address]    Culture PROTEUS MIRABILIS (A)  Final   Report Status PENDING  Incomplete  Blood Culture ID Panel (Reflexed)     Status: Abnormal   Collection Time: 06/09/19  5:00 PM  Result Value Ref Range Status   Enterococcus species NOT DETECTED NOT DETECTED Final   Listeria monocytogenes NOT DETECTED NOT DETECTED Final   Staphylococcus species NOT DETECTED NOT DETECTED Final   Staphylococcus aureus (BCID) NOT DETECTED NOT DETECTED Final   Streptococcus species NOT DETECTED NOT DETECTED Final   Streptococcus agalactiae NOT DETECTED NOT DETECTED Final   Streptococcus pneumoniae NOT DETECTED NOT DETECTED Final   Streptococcus pyogenes NOT DETECTED NOT DETECTED Final   Acinetobacter baumannii NOT DETECTED NOT DETECTED Final   Enterobacteriaceae species DETECTED (A) NOT DETECTED Final    Comment: Enterobacteriaceae represent a large family of gram-negative bacteria, not a single organism. CRITICAL RESULT CALLED TO, READ BACK BY AND VERIFIED WITH: Hughie Closs [facility] 06/11/19 1829 JDW    Enterobacter cloacae complex NOT DETECTED NOT DETECTED Final   Escherichia coli NOT DETECTED NOT DETECTED Final   Klebsiella oxytoca NOT DETECTED NOT DETECTED Final   Klebsiella pneumoniae NOT DETECTED NOT DETECTED Final   Proteus species DETECTED (A) NOT DETECTED Final    Comment: CRITICAL RESULT CALLED TO, READ BACK BY AND VERIFIED WITH: Hughie Closs [facility] 06/11/19 1829 JDW    Serratia marcescens NOT DETECTED NOT DETECTED Final   Carbapenem resistance NOT DETECTED NOT DETECTED Final   Haemophilus influenzae NOT DETECTED NOT DETECTED Final   Neisseria meningitidis NOT DETECTED NOT DETECTED Final   Pseudomonas aeruginosa NOT DETECTED NOT DETECTED Final   Candida albicans NOT DETECTED NOT DETECTED Final   Candida glabrata NOT DETECTED NOT DETECTED Final   Candida krusei NOT DETECTED NOT DETECTED  Final   Candida parapsilosis NOT DETECTED NOT DETECTED Final   Candida tropicalis NOT DETECTED NOT DETECTED Final    Comment: Performed at [facility], [place] [address] [place],  [address]  Blood Culture (routine x 2)     Status: None (Preliminary result)   Collection Time: 06/09/19  5:05 PM   Specimen: BLOOD RIGHT HAND  Result Value Ref  Range Status   Specimen Description BLOOD RIGHT HAND  Final   Special Requests   Final    BOTTLES DRAWN AEROBIC AND ANAEROBIC Blood Culture results may not be optimal due to an inadequate volume of blood received in culture bottles   Culture   Final    NO GROWTH 3 DAYS Performed at [facility], [place] [address] [place], [place] [address]    Report Status PENDING  Incomplete  Urine culture     Status: Abnormal   Collection Time: 06/09/19  5:25 PM   Specimen: Urine, Catheterized  Result Value Ref Range Status   Specimen Description URINE, CATHETERIZED  Final   Special Requests   Final    NONE Performed at [facility], [place] [address], [place], [place] [address]    Culture >=100,000 COLONIES/mL PSEUDOMONAS AERUGINOSA (A)  Final   Report Status 06/11/2019 FINAL  Final   Organism ID, Bacteria PSEUDOMONAS AERUGINOSA (A)  Final      Susceptibility   Pseudomonas aeruginosa - MIC*    CEFTAZIDIME 2 SENSITIVE Sensitive     CIPROFLOXACIN 2 INTERMEDIATE Intermediate     GENTAMICIN >=16 RESISTANT Resistant     IMIPENEM 2 SENSITIVE Sensitive     PIP/TAZO 8 SENSITIVE Sensitive     CEFEPIME 2 SENSITIVE Sensitive     * >=100,000 COLONIES/mL PSEUDOMONAS AERUGINOSA  Aerobic Culture (superficial specimen)     Status: None (Preliminary result)   Collection Time: 06/09/19  8:16 PM   Specimen: Ulcer  Result Value Ref Range Status   Specimen Description ULCER RIGHT BUTTOCKS  Final   Special Requests NONE  Final   Gram Stain   Final    ABUNDANT WBC PRESENT, PREDOMINANTLY PMN ABUNDANT GRAM NEGATIVE RODS RARE GRAM POSITIVE COCCI     Culture   Final    ABUNDANT KLEBSIELLA OXYTOCA ABUNDANT PROTEUS MIRABILIS REPEATING SUSCEPTIBILITY Performed at [facility], [address], [place], [place] [address]    Report Status PENDING  Incomplete   Organism ID, Bacteria PROTEUS MIRABILIS  Final      Susceptibility   Proteus mirabilis - MIC*    AMPICILLIN <=2 SENSITIVE Sensitive     CEFAZOLIN <=4 SENSITIVE Sensitive     CEFEPIME <=0.12 SENSITIVE Sensitive     CEFTAZIDIME <=1 SENSITIVE Sensitive     CEFTRIAXONE <=0.25 SENSITIVE Sensitive     CIPROFLOXACIN <=0.25 SENSITIVE Sensitive     GENTAMICIN <=1 SENSITIVE Sensitive     IMIPENEM 2 SENSITIVE Sensitive     TRIMETH/SULFA <=20 SENSITIVE Sensitive     AMPICILLIN/SULBACTAM <=2 SENSITIVE Sensitive     PIP/TAZO <=4 SENSITIVE Sensitive     * ABUNDANT PROTEUS MIRABILIS  Nasopharyngeal Culture     Status: None   Collection Time: 06/09/19  9:01 PM   Specimen: Nasal Mucosa  Result Value Ref Range Status   Specimen Description NASOPHARYNGEAL  Final   Special Requests NONE  Final   Culture   Final    NO MRSA DETECTED Performed at [facility], [place] [address] [place], [place] [address]    Report Status 06/12/2019 FINAL  Final  SARS CORONAVIRUS 2 (TAT 6-24 HRS) Nasopharyngeal Nasopharyngeal Swab     Status: None   Collection Time: 06/09/19  9:14 PM   Specimen: Nasopharyngeal Swab  Result Value Ref Range Status   SARS Coronavirus 2 NEGATIVE NEGATIVE Final    Comment: (NOTE) SARS-CoV-2 target nucleic acids are NOT DETECTED. The SARS-CoV-2 RNA is generally detectable in upper  and lower respiratory specimens during the acute phase of infection. Negative results do not preclude SARS-CoV-2 infection, do not rule out co-infections with other pathogens, and should not be used as the sole basis for treatment or other patient management decisions. Negative results must be combined with clinical observations, patient history, and epidemiological information. The  expected result is Negative. Fact Sheet for Patients: SugarRoll.be Fact Sheet for Healthcare Providers: https://www.woods-mathews.com/ This test is not yet approved or cleared by the [place] FDA and  has been authorized for detection and/or diagnosis of SARS-CoV-2 by FDA under an Emergency Use Authorization (EUA). This EUA will remain  in effect (meaning this test can be used) for the duration of the COVID-19 declaration under Section 56 4(b)(1) of the Act, 21 U.S.C. section 360bbb-3(b)(1), unless the authorization is terminated or revoked sooner. Performed at [facility], [place] [address], [place], [place] [address]   MRSA PCR Screening     Status: Abnormal   Collection Time: 06/10/19  5:14 AM   Specimen: Nasal Mucosa; Nasopharyngeal  Result Value Ref Range Status   MRSA by PCR POSITIVE (A) NEGATIVE Final    Comment:        The GeneXpert MRSA Assay (FDA approved for NASAL specimens only), is one component of a comprehensive MRSA colonization surveillance program. It is not intended to diagnose MRSA infection nor to guide or monitor treatment for MRSA infections. RESULT CALLED TO, READ BACK BY AND VERIFIED WITH: RN EU, T. [phone] FCP Performed at [facility], [place] [address], [place], [place] [address]      Studies: No results found.   Flora Lipps, MD  [facility] 06/13/2019

## 2019-06-13 NOTE — Plan of Care (Signed)
  Problem: Elimination: Goal: Will not experience complications related to urinary retention Outcome: Completed/Met   Problem: Elimination: Goal: Will not experience complications related to urinary retention Outcome: Completed/Met

## 2019-06-13 NOTE — Progress Notes (Signed)
Patient has had no change in overall RR and HR has remained rate of low 100s throughout the day. No change in BP or LOC. Will continue to monitor for any acute changes

## 2019-06-13 NOTE — Progress Notes (Signed)
Palliative care progress note  Attempted to call family.  Went directly to [employer].  Micheline Rough, MD [facility] Team [phone]  NO CHARGE NOTE

## 2019-06-14 LAB — CBC WITH DIFFERENTIAL/PLATELET
Abs Immature Granulocytes: 0 10*3/uL (ref 0.00–0.07)
Basophils Absolute: 0 10*3/uL (ref 0.0–0.1)
Basophils Relative: 0 %
Eosinophils Absolute: 0.1 10*3/uL (ref 0.0–0.5)
Eosinophils Relative: 1 %
HCT: 24.4 % — ABNORMAL LOW (ref 39.0–52.0)
Hemoglobin: 7.4 g/dL — ABNORMAL LOW (ref 13.0–17.0)
Lymphocytes Relative: 17 %
Lymphs Abs: 1.4 10*3/uL (ref 0.7–4.0)
MCH: 27 pg (ref 26.0–34.0)
MCHC: 30.3 g/dL (ref 30.0–36.0)
MCV: 89.1 fL (ref 80.0–100.0)
Monocytes Absolute: 0.7 10*3/uL (ref 0.1–1.0)
Monocytes Relative: 8 %
Neutro Abs: 6.3 10*3/uL (ref 1.7–7.7)
Neutrophils Relative %: 74 %
Platelets: 295 10*3/uL (ref 150–400)
RBC: 2.74 MIL/uL — ABNORMAL LOW (ref 4.22–5.81)
RDW: 20.3 % — ABNORMAL HIGH (ref 11.5–15.5)
WBC: 8.5 10*3/uL (ref 4.0–10.5)
nRBC: 0.8 % — ABNORMAL HIGH (ref 0.0–0.2)
nRBC: 2 /100 WBC — ABNORMAL HIGH

## 2019-06-14 LAB — COMPREHENSIVE METABOLIC PANEL
ALT: 52 U/L — ABNORMAL HIGH (ref 0–44)
AST: 147 U/L — ABNORMAL HIGH (ref 15–41)
Albumin: <1 g/dL — ABNORMAL LOW (ref 3.5–5.0)
Alkaline Phosphatase: 1588 U/L — ABNORMAL HIGH (ref 38–126)
Anion gap: 8 (ref 5–15)
BUN: 24 mg/dL — ABNORMAL HIGH (ref 8–23)
CO2: 29 mmol/L (ref 22–32)
Calcium: 8.7 mg/dL — ABNORMAL LOW (ref 8.9–10.3)
Chloride: 112 mmol/L — ABNORMAL HIGH (ref 98–111)
Creatinine, Ser: 0.8 mg/dL (ref 0.61–1.24)
GFR calc Af Amer: >60 mL/min (ref >60)
GFR calc non Af Amer: >60 mL/min (ref >60)
Glucose, Bld: 264 mg/dL — ABNORMAL HIGH (ref 70–99)
Potassium: 4.1 mmol/L (ref 3.5–5.1)
Sodium: 149 mmol/L — ABNORMAL HIGH (ref 135–145)
Total Bilirubin: 0.7 mg/dL (ref 0.3–1.2)
Total Protein: 5.4 g/dL — ABNORMAL LOW (ref 6.5–8.1)

## 2019-06-14 LAB — GLUCOSE, CAPILLARY
Glucose-Capillary: 222 mg/dL — ABNORMAL HIGH (ref 70–99)
Glucose-Capillary: 223 mg/dL — ABNORMAL HIGH (ref 70–99)
Glucose-Capillary: 88 mg/dL (ref 70–99)

## 2019-06-14 LAB — MAGNESIUM: Magnesium: 2.3 mg/dL (ref 1.7–2.4)

## 2019-06-14 LAB — CULTURE, BLOOD (ROUTINE X 2): Culture: NO GROWTH

## 2019-06-14 MED ORDER — GLYCOPYRROLATE NICU IV SYRINGE 0.2 MG/ML: 4.0000 ug/kg | Freq: Three times a day (TID) | INTRAMUSCULAR | Status: DC | PRN

## 2019-06-14 MED ORDER — GLYCOPYRROLATE 0.2 MG/ML IJ SOLN
0.2000 mg | Freq: Three times a day (TID) | INTRAMUSCULAR | Status: DC | PRN
  Filled 2019-06-14: qty 1

## 2019-06-14 MED ORDER — CHLORHEXIDINE GLUCONATE CLOTH 2 % EX PADS
6.0000 | MEDICATED_PAD | Freq: Every day | CUTANEOUS | Status: DC
  Administered 2019-06-15: 6 via TOPICAL

## 2019-06-14 MED ORDER — MORPHINE SULFATE (PF) 2 MG/ML IV SOLN
2.0000 mg | INTRAVENOUS | Status: DC | PRN
  Administered 2019-06-14 – 2019-06-16 (×8): 2 mg via INTRAVENOUS
  Filled 2019-06-14 (×9): qty 1

## 2019-06-14 NOTE — Plan of Care (Signed)
  Problem: Education: Goal: Knowledge of General Education information will improve Description Including pain rating scale, medication(s)/side effects and non-pharmacologic comfort measures Outcome: Progressing   

## 2019-06-14 NOTE — Progress Notes (Addendum)
Pharmacy Antibiotic Note  Roy Crawford is a 64 y.o. male admitted on 06/09/2019 with sepsis.  Pharmacy has been consulted for cefepime dosing.  Vancomycin stopped 1/29.  Fluconazole added for many yeast on UA.  Wound culture with proteus, ESBL klebsiella, UCx with pseudomonas.  Today is day 5 of cefepime.  Klebsiella in wound is resistant to cefepime, but query if not truly infected may not need to treat.  Plan: Cefepime 2g IV q 8 hrs. F/u plans for LOT.  Height: 4\' 9"  (144.8 cm) Weight: 185 lb (83.9 kg) IBW/kg (Calculated) : 43.1  Temp (24hrs), Avg:98.3 F (36.8 C), Min:98 F (36.7 C), Max:98.6 F (37 C)  Recent Labs  Lab 06/09/19 1709 06/09/19 1710 06/09/19 1832 06/09/19 2015 06/09/19 2123 06/10/19 0250 06/10/19 0816 06/11/19 0402 06/11/19 0620 06/12/19 0510 06/13/19 0407 06/14/19 0840  WBC  --  11.7*  --   --   --  11.2*  --  8.3  --  9.9  --  8.5  CREATININE  --  0.80  --   --    < > 0.70 0.74  --  0.93 0.66 0.73  --   LATICACIDVEN 4.6*  --   --  6.6*  --  2.9*  --   --   --   --   --   --   VANCORANDOM  --   --  15  --   --   --   --   --   --   --   --   --    < > = values in this interval not displayed.    Estimated Creatinine Clearance: 76.3 mL/min (by C-G formula based on SCr of 0.73 mg/dL).    Allergies  Allergen Reactions  . Amoxicillin-Pot Clavulanate     Per MAR  . Cephalexin     Per MAR  . Clavulanic Acid     Per MAR  . Penicillins     Per MAR    Antimicrobials this admission: - Vanc >> end 1/29 - Cefepime 1/28 >> Yeast in u/a - add fluconazole  Dose adjustments this admission:  Microbiology results: 1/27 right buttocks ulcer: pan-sens proteus, klebsiella (ESBL) 1/27 BCx x 2: 1 of 2 -proteus (pan-sensitive) 1/27 UCx: pseudomonas 1/28 MRSA PCR +  Thank you for allowing pharmacy to be a part of this patient's care.  Pat Patrick 06/14/2019 9:45 AM

## 2019-06-14 NOTE — Progress Notes (Signed)
PROGRESS NOTE  Roy Crawford [id number] DOB: July 10, 1955 DOA: 06/09/2019 PCP: [name], MD   LOS: 5 days   Brief narrative:  As per HPI,  Roy Crawford is a 64 y.o. male with medical history significant for cerebral palsy with Intellectual disability, quadriplegia and tracheostomy dependency, chronic contractures, epilepsy and recent refractory pseudomonas pneumonia requiring baseline 8L via trach and outpatient IV antibiotics, resident at [facility]. Reportedly sent over for possible worsening ulcer. Patient is nonverbal at baseline and unable to provide history.   In the ED: He was febrile up to 100.4, tachypneic at baseline 8L, 35% FiO2 through tracheostomy collar. WBC of 11.7, hemoglobin of 7.4 down from 8.5, platelet of 411. Lactate of 4.6. Na of 155, glucose of 127, AST of 114, ALT of 52, alkaline phosphatase of 1196. UA with positive nitrate and large leukocytes and many bacteria with budding yeast. He was given Tylenol suppository, 3L of LR fluid resuscitation, and started on vancomycin and cefepime.  Assessment/Plan:  Principal Problem:   Sepsis ([place]) Active Problems:   Tracheostomy dependent (HCC)   Epilepsy (HCC)   Cerebral palsy, quadriplegic (HCC)   Pressure ulcer of right buttock   Hypernatremia   Anemia   Thrombocytosis (HCC)   Essential hypertension   PEG (percutaneous endoscopic gastrostomy) status (HCC)   Transaminitis  Sepsis secondary to UTI with chronic indwelling foley catheter/cellulitis at PEG tube site Urinalysis also showed some budding yeast.  was on IV Vancomycin for unknown indication. He was previously only discharged on IV cefepime for 2 weeks in October for pseudomonas pneumonia. on fluconazole.Currently on cefepime.  Blood culture showing Proteus mirabilis.  Local wound culture from the right buttocks wound showing Proteus mirabilis as well.    Episodes of ventricular tachycardia this morning.  Chronic respiratory failure status  post tracheostomy.  Currently on 5 L/min saturating 99%.  Proteus sepsis.  On cefepime.    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  Wound with WBC gram-negative rods in the wound culture- although does not appear infected. No definitive osteomyelitis seen on imaging  Posterior auricle wound bilaterally Wound care on board.  Hypernatremia  Na of 155 on presentation. Sodium of 149 and uptrending today.  He was initially on D5 water and  tube water flushes.  Chronic anemia   Transfused 1 unit of packed RBC 06/10/2019.  No external bleeding.    Mild thrombocytosis  Reactive.  Normalized at this time  Transaminitis/elevated alkaline phosphatase  AST of 114, ALT of 52, alkaline phosphatase of 1196 on presentation.  Likely from from sepsis.    Cerebral Palsy with Intellectual disability Patient is nonverbal and contracted at baseline. Poor functional and cognitive ability with extensive contractures and bedbound status  Epilepsy On  Keppra, Valproic acid  Hypokalemia and hypomagnesemia.  Replenished IV .   HTN Was on Cardizem, Lopressor PO.  Metoprolol p.o. and Cardizem was hold due to prolonged QTC. Marland Kitchen   DM type II Received sliding scale.  Tube feeding  Continue as tolerated  VTE Prophylaxis:  Lovenox sub q  Code Status: DNR.    Family Communication: I had a prolonged discussion with the patient's nephew on the phone today and updated him about the clinical condition of the patient.  Patient does have extensive comorbidities including multidrug-resistant bacterial infection, bedbound status with decubitus ulceration and extremely poor functional status.  Patient's nephew expressed that he wanted his uncle to be comfortable and not be in pain and did  not wish to continue aggressive treatment at this time so comfort care has been decided.  Goals of comfort care was explained to the nephew in detail.  At this time, we will put the patient on Ativan  and morphine for comfort.  Disposition Plan: Comfort care initiated in the hospital.  Anticipate hospital death.  Consultants:  Palliative care  Procedures:  None  Antibiotics: . Vancomycin, 06/09/2019> 1/29 . Cefepime 06/09/2019> . Fluconazole 06/09/2019>  Anti-infectives (From admission, onward)   Start     Dose/Rate Route Frequency Ordered Stop   06/10/19 0230  ceFEPIme (MAXIPIME) 2 g in sodium chloride 0.9 % 100 mL IVPB  Status:  Discontinued     2 g 200 mL/hr over 30 Minutes Intravenous Every 8 hours 06/09/19 1942 06/14/19 1029   06/09/19 2045  fluconazole (DIFLUCAN) tablet 200 mg  Status:  Discontinued     200 mg Oral Daily 06/09/19 2040 06/14/19 1029   06/09/19 1945  vancomycin (VANCOREADY) IVPB 1250 mg/250 mL  Status:  Discontinued     1,250 mg 166.7 mL/hr over 90 Minutes Intravenous Every 24 hours 06/09/19 1942 06/11/19 1845   06/09/19 1815  ceFEPIme (MAXIPIME) 2 g in sodium chloride 0.9 % 100 mL IVPB     2 g 200 mL/hr over 30 Minutes Intravenous  Once 06/09/19 1815 06/09/19 1910     Subjective: Today, staff reported a few episodes of ventricular tachycardia, clinical condition of the patient has not changed.  Objective: Vitals:   06/14/19 0600 06/14/19 0700  BP: (!) 118/57 133/79  Pulse: 99 66  Resp: (!) 31 (!) 31  Temp:    SpO2: 93% 99%    Intake/Output Summary (Last 24 hours) at 06/14/2019 1036 Last data filed at 06/14/2019 0846 Gross per 24 hour  Intake 600 ml  Output 825 ml  Net -225 ml   Filed Weights   06/11/19 0200 06/13/19 0408 06/14/19 0300  Weight: 80.8 kg 82.1 kg 83.9 kg   Body mass index is 40.03 kg/m.   Physical Exam: General: Patient is nonverbal, chronically ill, deconditioned and frail.  Contracted extremities. HENT: Normocephalic, pupils equally reacting to light, auricles with wound.  Tracheostomy in place. Chest:   Diminished breath sounds bilaterally.  Coarse breath sounds. CVS: S1 &S2 heard. No murmur.  Regular rate and  rhythm. Abdomen: Soft, nontender, nondistended.  PEG tube in place.  Bowel sounds are heard.  Extremities: Contraction of all extremities. Psych: Nonverbal. CNS: Nonverbal, contracted.  Does not follow command.    Skin:   Right buttocks with a stage IV ulcer present on admission   Data Review: I have personally reviewed the following laboratory data and studies,  CBC: Recent Labs  Lab 06/09/19 1710 06/09/19 1710 06/10/19 0250 06/10/19 1859 06/11/19 0402 06/12/19 0510 06/14/19 0840  WBC 11.7*  --  11.2*  --  8.3 9.9 8.5  NEUTROABS 8.8*  --   --   --   --   --  6.3  HGB 7.4*   < > 6.9* 6.9* 8.0* 8.1* 7.4*  HCT 24.8*   < > 22.3* 21.7* 25.5* 26.4* 24.4*  MCV 85.2  --  83.5  --  86.1 88.3 89.1  PLT 411*  --  398  --  299 277 295   < > = values in this interval not displayed.   Basic Metabolic Panel: Recent Labs  Lab 06/10/19 0816 06/11/19 0620 06/12/19 0510 06/13/19 0407 06/14/19 0840  NA 153* 145 144 145 149*  K 3.2* 3.0*  3.3* 3.9 4.1  CL 108 105 108 108 112*  CO2 38* 32 29 29 29   GLUCOSE 133* 183* 154* 226* 264*  BUN 38* 30* 27* 23 24*  CREATININE 0.74 0.93 0.66 0.73 0.80  CALCIUM 8.9 8.1* 7.5* 8.3* 8.7*  MG  --  1.7 1.5* 2.1 2.3  PHOS  --  3.6  --   --   --    Liver Function Tests: Recent Labs  Lab 06/09/19 1710 06/10/19 0250 06/11/19 0620 06/14/19 0840  AST 114* 80* 76* 147*  ALT 52* 41 30 52*  ALKPHOS 1,195* 995* 789* 1,588*  BILITOT 0.5 0.8 0.9 0.7  PROT 6.6 6.0* 5.3* 5.4*  ALBUMIN 1.2* 1.1* <1.0* <1.0*   No results for input(s): LIPASE, AMYLASE in the last 168 hours. No results for input(s): AMMONIA in the last 168 hours. Cardiac Enzymes: No results for input(s): CKTOTAL, CKMB, CKMBINDEX, TROPONINI in the last 168 hours. BNP (last 3 results) Recent Labs    03/05/19 1830  BNP 655.1*    ProBNP (last 3 results) No results for input(s): PROBNP in the last 8760 hours.  CBG: Recent Labs  Lab 06/13/19 1116 06/13/19 1626 06/13/19 2112  06/14/19 0622 06/14/19 0812  GLUCAP 210* 192* 176* 222* 223*   Recent Results (from the past 240 hour(s))  Blood Culture (routine x 2)     Status: Abnormal   Collection Time: 06/09/19  5:00 PM   Specimen: BLOOD  Result Value Ref Range Status   Specimen Description BLOOD RIGHT WRIST  Final   Special Requests   Final    BOTTLES DRAWN AEROBIC AND ANAEROBIC Blood Culture results may not be optimal due to an inadequate volume of blood received in culture bottles   Culture  Setup Time   Final    GRAM NEGATIVE RODS ANAEROBIC BOTTLE ONLY CRITICAL RESULT CALLED TO, READ BACK BY AND VERIFIED WITH: Hughie Closs [facility] 06/11/19 1829 JDW Performed at [facility], [place] [address], [place], [place] [address]    Culture PROTEUS MIRABILIS (A)  Final   Report Status 06/13/2019 FINAL  Final   Organism ID, Bacteria PROTEUS MIRABILIS  Final      Susceptibility   Proteus mirabilis - MIC*    AMPICILLIN <=2 SENSITIVE Sensitive     CEFAZOLIN <=4 SENSITIVE Sensitive     CEFEPIME <=0.12 SENSITIVE Sensitive     CEFTAZIDIME <=1 SENSITIVE Sensitive     CEFTRIAXONE <=0.25 SENSITIVE Sensitive     CIPROFLOXACIN <=0.25 SENSITIVE Sensitive     GENTAMICIN <=1 SENSITIVE Sensitive     IMIPENEM 1 SENSITIVE Sensitive     TRIMETH/SULFA <=20 SENSITIVE Sensitive     AMPICILLIN/SULBACTAM <=2 SENSITIVE Sensitive     PIP/TAZO <=4 SENSITIVE Sensitive     * PROTEUS MIRABILIS  Blood Culture ID Panel (Reflexed)     Status: Abnormal   Collection Time: 06/09/19  5:00 PM  Result Value Ref Range Status   Enterococcus species NOT DETECTED NOT DETECTED Final   Listeria monocytogenes NOT DETECTED NOT DETECTED Final   Staphylococcus species NOT DETECTED NOT DETECTED Final   Staphylococcus aureus (BCID) NOT DETECTED NOT DETECTED Final   Streptococcus species NOT DETECTED NOT DETECTED Final   Streptococcus agalactiae NOT DETECTED NOT DETECTED Final   Streptococcus pneumoniae NOT DETECTED NOT DETECTED Final   Streptococcus  pyogenes NOT DETECTED NOT DETECTED Final   Acinetobacter baumannii NOT DETECTED NOT DETECTED Final   Enterobacteriaceae species DETECTED (A) NOT DETECTED Final    Comment: Enterobacteriaceae represent a  large family of gram-negative bacteria, not a single organism. CRITICAL RESULT CALLED TO, READ BACK BY AND VERIFIED WITH: Hughie Closs [facility] 06/11/19 1829 JDW    Enterobacter cloacae complex NOT DETECTED NOT DETECTED Final   Escherichia coli NOT DETECTED NOT DETECTED Final   Klebsiella oxytoca NOT DETECTED NOT DETECTED Final   Klebsiella pneumoniae NOT DETECTED NOT DETECTED Final   Proteus species DETECTED (A) NOT DETECTED Final    Comment: CRITICAL RESULT CALLED TO, READ BACK BY AND VERIFIED WITH: Hughie Closs [facility] 06/11/19 1829 JDW    Serratia marcescens NOT DETECTED NOT DETECTED Final   Carbapenem resistance NOT DETECTED NOT DETECTED Final   Haemophilus influenzae NOT DETECTED NOT DETECTED Final   Neisseria meningitidis NOT DETECTED NOT DETECTED Final   Pseudomonas aeruginosa NOT DETECTED NOT DETECTED Final   Candida albicans NOT DETECTED NOT DETECTED Final   Candida glabrata NOT DETECTED NOT DETECTED Final   Candida krusei NOT DETECTED NOT DETECTED Final   Candida parapsilosis NOT DETECTED NOT DETECTED Final   Candida tropicalis NOT DETECTED NOT DETECTED Final    Comment: Performed at [facility], [place] [address] [place], [place] [address]  Blood Culture (routine x 2)     Status: None (Preliminary result)   Collection Time: 06/09/19  5:05 PM   Specimen: BLOOD RIGHT HAND  Result Value Ref Range Status   Specimen Description BLOOD RIGHT HAND  Final   Special Requests   Final    BOTTLES DRAWN AEROBIC AND ANAEROBIC Blood Culture results may not be optimal due to an inadequate volume of blood received in culture bottles   Culture   Final    NO GROWTH 4 DAYS Performed at [facility], [place] [address], [place], [place] [address]    Report Status PENDING  Incomplete  Urine  culture     Status: Abnormal   Collection Time: 06/09/19  5:25 PM   Specimen: Urine, Catheterized  Result Value Ref Range Status   Specimen Description URINE, CATHETERIZED  Final   Special Requests   Final    NONE Performed at [facility], [place] [address] [place], [place] [address]    Culture >=100,000 COLONIES/mL PSEUDOMONAS AERUGINOSA (A)  Final   Report Status 06/11/2019 FINAL  Final   Organism ID, Bacteria PSEUDOMONAS AERUGINOSA (A)  Final      Susceptibility   Pseudomonas aeruginosa - MIC*    CEFTAZIDIME 2 SENSITIVE Sensitive     CIPROFLOXACIN 2 INTERMEDIATE Intermediate     GENTAMICIN >=16 RESISTANT Resistant     IMIPENEM 2 SENSITIVE Sensitive     PIP/TAZO 8 SENSITIVE Sensitive     CEFEPIME 2 SENSITIVE Sensitive     * >=100,000 COLONIES/mL PSEUDOMONAS AERUGINOSA  Aerobic Culture (superficial specimen)     Status: None   Collection Time: 06/09/19  8:16 PM   Specimen: Ulcer  Result Value Ref Range Status   Specimen Description ULCER RIGHT BUTTOCKS  Final   Special Requests NONE  Final   Gram Stain   Final    ABUNDANT WBC PRESENT, PREDOMINANTLY PMN ABUNDANT GRAM NEGATIVE RODS RARE GRAM POSITIVE COCCI Performed at [facility], [place] [address], [place], [place] [address]    Culture   Final    ABUNDANT KLEBSIELLA PNEUMONIAE ABUNDANT PROTEUS MIRABILIS Confirmed Extended Spectrum Beta-Lactamase Producer (ESBL).  In bloodstream infections from ESBL organisms, carbapenems are preferred over piperacillin/tazobactam. They are shown to have a lower risk of mortality.    Report Status 06/13/2019 FINAL  Final   Organism ID, Bacteria PROTEUS MIRABILIS  Final   Organism ID, Bacteria KLEBSIELLA PNEUMONIAE  Final      Susceptibility   Klebsiella pneumoniae - MIC*    AMPICILLIN >=32 RESISTANT Resistant     CEFAZOLIN >=64 RESISTANT Resistant     CEFEPIME >=32 RESISTANT Resistant     CEFTAZIDIME >=64 RESISTANT Resistant     CEFTRIAXONE >=64 RESISTANT Resistant      CIPROFLOXACIN >=4 RESISTANT Resistant     GENTAMICIN >=16 RESISTANT Resistant     IMIPENEM <=0.25 SENSITIVE Sensitive     TRIMETH/SULFA >=320 RESISTANT Resistant     AMPICILLIN/SULBACTAM >=32 RESISTANT Resistant     PIP/TAZO 32 INTERMEDIATE Intermediate     * ABUNDANT KLEBSIELLA PNEUMONIAE   Proteus mirabilis - MIC*    AMPICILLIN <=2 SENSITIVE Sensitive     CEFAZOLIN <=4 SENSITIVE Sensitive     CEFEPIME <=0.12 SENSITIVE Sensitive     CEFTAZIDIME <=1 SENSITIVE Sensitive     CEFTRIAXONE <=0.25 SENSITIVE Sensitive     CIPROFLOXACIN <=0.25 SENSITIVE Sensitive     GENTAMICIN <=1 SENSITIVE Sensitive     IMIPENEM 2 SENSITIVE Sensitive     TRIMETH/SULFA <=20 SENSITIVE Sensitive     AMPICILLIN/SULBACTAM <=2 SENSITIVE Sensitive     PIP/TAZO <=4 SENSITIVE Sensitive     * ABUNDANT PROTEUS MIRABILIS  Nasopharyngeal Culture     Status: None   Collection Time: 06/09/19  9:01 PM   Specimen: Nasal Mucosa  Result Value Ref Range Status   Specimen Description NASOPHARYNGEAL  Final   Special Requests NONE  Final   Culture   Final    NO MRSA DETECTED Performed at [facility], [address] [place], [place] [address]    Report Status 06/12/2019 FINAL  Final  SARS CORONAVIRUS 2 (TAT 6-24 HRS) Nasopharyngeal Nasopharyngeal Swab     Status: None   Collection Time: 06/09/19  9:14 PM   Specimen: Nasopharyngeal Swab  Result Value Ref Range Status   SARS Coronavirus 2 NEGATIVE NEGATIVE Final    Comment: (NOTE) SARS-CoV-2 target nucleic acids are NOT DETECTED. The SARS-CoV-2 RNA is generally detectable in upper and lower respiratory specimens during the acute phase of infection. Negative results do not preclude SARS-CoV-2 infection, do not rule out co-infections with other pathogens, and should not be used as the sole basis for treatment or other patient management decisions. Negative results must be combined with clinical observations, patient history, and epidemiological information. The  expected result is Negative. Fact Sheet for Patients: SugarRoll.be Fact Sheet for Healthcare Providers: https://www.woods-mathews.com/ This test is not yet approved or cleared by the [place] FDA and  has been authorized for detection and/or diagnosis of SARS-CoV-2 by FDA under an Emergency Use Authorization (EUA). This EUA will remain  in effect (meaning this test can be used) for the duration of the COVID-19 declaration under Section 56 4(b)(1) of the Act, 21 U.S.C. section 360bbb-3(b)(1), unless the authorization is terminated or revoked sooner. Performed at [facility], [place] [address], [place], [place] [address]   MRSA PCR Screening     Status: Abnormal   Collection Time: 06/10/19  5:14 AM   Specimen: Nasal Mucosa; Nasopharyngeal  Result Value Ref Range Status   MRSA by PCR POSITIVE (A) NEGATIVE Final    Comment:        The GeneXpert MRSA Assay (FDA approved for NASAL specimens only), is one component of a comprehensive MRSA colonization surveillance program. It is not intended to diagnose MRSA  infection nor to guide or monitor treatment for MRSA infections. RESULT CALLED TO, READ BACK BY AND VERIFIED WITH: RN EU, T. [phone] FCP Performed at [facility], [place] [address], [place], [place] [address]      Studies: No results found.   Flora Lipps, MD  [facility] 06/14/2019

## 2019-06-14 NOTE — Care Management Important Message (Signed)
Important Message  Patient Details  Name: Roy Crawford MRN: [id number] Date of Birth: 1955-06-04   Medicare Important Message Given:  Yes  Patient on precaution.gave letter to NT Lauren.     [place], [place] 06/14/2019, 2:04 PM

## 2019-06-15 NOTE — Progress Notes (Signed)
Nutrition Brief Note  Chart reviewed. Pt now transitioning to comfort care.  No further nutrition interventions warranted at this time.  Please re-consult as needed.   Mozel Burdett A. Dimercurio, RD, LDN, CDCES Registered Dietitian II Certified Diabetes Care and Education Specialist Pager: [phone] After hours Pager: [phone]  

## 2019-06-15 NOTE — Progress Notes (Addendum)
PROGRESS NOTE  Roy Crawford [id number] DOB: 1955/08/24 DOA: 06/09/2019 PCP: [name], MD   LOS: 6 days   Brief narrative:  As per HPI,  Roy Crawford is a 64 y.o. male with medical history significant for cerebral palsy with Intellectual disability, quadriplegia and tracheostomy dependency, chronic contractures, epilepsy and recent refractory pseudomonas pneumonia requiring baseline 8L via trach and outpatient IV antibiotics, resident at [facility]. Reportedly sent over for possible worsening ulcer. Patient is nonverbal at baseline and unable to provide history. In the ED: Patient was febrile up to 100.4, tachypneic at baseline 8L, 35% FiO2 through tracheostomy collar. WBC of 11.7, hemoglobin of 7.4 down from 8.5, platelet of 411. Lactate of 4.6. Na of 155, glucose of 127, AST of 114, ALT of 52, alkaline phosphatase of 1196. UA with positive nitrate and large leukocytes and many bacteria with budding yeast. He was given Tylenol suppository, 3L of LR fluid resuscitation, and started on vancomycin and cefepime.  Assessment/Plan:  Principal Problem:   Sepsis ([place]) Active Problems:   Tracheostomy dependent (HCC)   Epilepsy (HCC)   Cerebral palsy, quadriplegic (HCC)   Pressure ulcer of right buttock   Hypernatremia   Anemia   Thrombocytosis (HCC)   Essential hypertension   PEG (percutaneous endoscopic gastrostomy) status (HCC)   Transaminitis  Sepsis secondary to UTI with chronic indwelling foley catheter/cellulitis at PEG tube site/fungal UTI. Urinalysis  showed some budding yeast.  was on IV Vancomycin for unknown indication prior to hospitalization. He was previously only discharged on IV cefepime for 2 weeks in October for pseudomonas pneumonia. on fluconazole. Received cefepime in the hospital this admission.  Blood culture showing Proteus mirabilis.  Local wound culture from the right buttocks wound showing Proteus and MDR Klebsiella.  Chronic respiratory  failure status post tracheostomy.  Currently on 5 L/min saturating 90%.  Proteus sepsis. 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  Hypernatremia  Na of 155 on presentation.  He was initially on D5 water and  tube water flushes.  Chronic anemia   Transfused 1 unit of packed RBC 06/10/2019.  No external bleeding.    Mild thrombocytosis  Reactive.  Transaminitis/elevated alkaline phosphatase  AST of 114, ALT of 52, alkaline phosphatase of 1196 on presentation.  Likely from from sepsis.    Cerebral Palsy with Intellectual disability Patient is nonverbal and contracted at baseline. Poor functional and cognitive ability with extensive contractures and bedbound status  Epilepsy Was on Keppra, Valproic acid. No seizures, on ativan for comfort.  Hypokalemia and hypomagnesemia.  Replenished IV .   HTN Was on Cardizem, Lopressor PO.  Metoprolol p.o. and Cardizem was hold due to prolonged QTC. Marland Kitchen   DM type II Received sliding scale.  Tube feeding  On comfort care only. hold  VTE Prophylaxis: None for comfort care.  Code Status: DNR.    Family Communication: Patient is currently on comfort care.  Will consider residential hospice.  I have spoken with the patient's nephew yesterday regarding the plan.  Disposition Plan: Comfort care initiated in the hospital.  Consider residential hospice.  Consultants:  Palliative care  Procedures:  None  Antibiotics: . Vancomycin, 06/09/2019> 1/29 . Cefepime 06/09/2019> . Fluconazole 06/09/2019>  Anti-infectives (From admission, onward)   Start     Dose/Rate Route Frequency Ordered Stop   06/10/19 0230  ceFEPIme (MAXIPIME) 2 g in sodium chloride  0.9 % 100 mL IVPB  Status:  Discontinued     2 g 200 mL/hr over 30 Minutes Intravenous Every 8 hours 06/09/19 1942 06/14/19 1029   06/09/19 2045   fluconazole (DIFLUCAN) tablet 200 mg  Status:  Discontinued     200 mg Oral Daily 06/09/19 2040 06/14/19 1029   06/09/19 1945  vancomycin (VANCOREADY) IVPB 1250 mg/250 mL  Status:  Discontinued     1,250 mg 166.7 mL/hr over 90 Minutes Intravenous Every 24 hours 06/09/19 1942 06/11/19 1845   06/09/19 1815  ceFEPIme (MAXIPIME) 2 g in sodium chloride 0.9 % 100 mL IVPB     2 g 200 mL/hr over 30 Minutes Intravenous  Once 06/09/19 1815 06/09/19 1910     Subjective: Today, no interval changes.  Appears to be comfortable.  Objective: Vitals:   06/14/19 2332 06/15/19 0336  BP:  (!) 141/64  Pulse: (!) 110 (!) 117  Resp: (!) 23 (!) 27  Temp:    SpO2: (!) 88% 90%    Intake/Output Summary (Last 24 hours) at 06/15/2019 0847 Last data filed at 06/15/2019 0600 Gross per 24 hour  Intake 0 ml  Output 975 ml  Net -975 ml   Filed Weights   06/11/19 0200 06/13/19 0408 06/14/19 0300  Weight: 80.8 kg 82.1 kg 83.9 kg   Body mass index is 40.03 kg/m.   Physical Exam: General: Patient is nonverbal, chronically ill, deconditioned and frail.  Contracted extremities.  Abdominal HENT:  Tracheostomy in place. Chest:   Diminished breath sounds bilaterally.  Coarse breath sounds. CVS: S1 &S2 heard. No murmur.  Regular rate and rhythm. Abdomen: Soft, nontender, nondistended.  PEG tube in place.  Bowel sounds are heard.  Extremities: Contraction of all extremities. Psych: Nonverbal. CNS: Nonverbal, contracted.  Does not follow command.    Skin:   Right buttocks with  stage IV ulcer, present on admission   Data Review: I have personally reviewed the following laboratory data and studies,  CBC: Recent Labs  Lab 06/09/19 1710 06/09/19 1710 06/10/19 0250 06/10/19 1859 06/11/19 0402 06/12/19 0510 06/14/19 0840  WBC 11.7*  --  11.2*  --  8.3 9.9 8.5  NEUTROABS 8.8*  --   --   --   --   --  6.3  HGB 7.4*   < > 6.9* 6.9* 8.0* 8.1* 7.4*  HCT 24.8*   < > 22.3* 21.7* 25.5* 26.4* 24.4*  MCV 85.2  --   83.5  --  86.1 88.3 89.1  PLT 411*  --  398  --  299 277 295   < > = values in this interval not displayed.   Basic Metabolic Panel: Recent Labs  Lab 06/10/19 0816 06/11/19 0620 06/12/19 0510 06/13/19 0407 06/14/19 0840  NA 153* 145 144 145 149*  K 3.2* 3.0* 3.3* 3.9 4.1  CL 108 105 108 108 112*  CO2 38* 32 29 29 29   GLUCOSE 133* 183* 154* 226* 264*  BUN 38* 30* 27* 23 24*  CREATININE 0.74 0.93 0.66 0.73 0.80  CALCIUM 8.9 8.1* 7.5* 8.3* 8.7*  MG  --  1.7 1.5* 2.1 2.3  PHOS  --  3.6  --   --   --    Liver Function Tests: Recent Labs  Lab 06/09/19 1710 06/10/19 0250 06/11/19 0620 06/14/19 0840  AST 114* 80* 76* 147*  ALT 52* 41 30 52*  ALKPHOS 1,195* 995* 789* 1,588*  BILITOT 0.5 0.8 0.9 0.7  PROT 6.6 6.0* 5.3* 5.4*  ALBUMIN 1.2* 1.1* <1.0* <1.0*   No results for input(s): LIPASE, AMYLASE in the last 168 hours. No results for input(s): AMMONIA in the last 168 hours. Cardiac Enzymes: No results for input(s): CKTOTAL, CKMB, CKMBINDEX, TROPONINI in the last 168 hours. BNP (last 3 results) Recent Labs    03/05/19 1830  BNP 655.1*    ProBNP (last 3 results) No results for input(s): PROBNP in the last 8760 hours.  CBG: Recent Labs  Lab 06/13/19 1626 06/13/19 2112 06/14/19 0622 06/14/19 0812 06/14/19 2130  GLUCAP 192* 176* 222* 223* 88   Recent Results (from the past 240 hour(s))  Blood Culture (routine x 2)     Status: Abnormal   Collection Time: 06/09/19  5:00 PM   Specimen: BLOOD  Result Value Ref Range Status   Specimen Description BLOOD RIGHT WRIST  Final   Special Requests   Final    BOTTLES DRAWN AEROBIC AND ANAEROBIC Blood Culture results may not be optimal due to an inadequate volume of blood received in culture bottles   Culture  Setup Time   Final    GRAM NEGATIVE RODS ANAEROBIC BOTTLE ONLY CRITICAL RESULT CALLED TO, READ BACK BY AND VERIFIED WITH: Hughie Closs [facility] 06/11/19 1829 JDW Performed at [facility], [place] [address],  [place], [place] [address]    Culture PROTEUS MIRABILIS (A)  Final   Report Status 06/13/2019 FINAL  Final   Organism ID, Bacteria PROTEUS MIRABILIS  Final      Susceptibility   Proteus mirabilis - MIC*    AMPICILLIN <=2 SENSITIVE Sensitive     CEFAZOLIN <=4 SENSITIVE Sensitive     CEFEPIME <=0.12 SENSITIVE Sensitive     CEFTAZIDIME <=1 SENSITIVE Sensitive     CEFTRIAXONE <=0.25 SENSITIVE Sensitive     CIPROFLOXACIN <=0.25 SENSITIVE Sensitive     GENTAMICIN <=1 SENSITIVE Sensitive     IMIPENEM 1 SENSITIVE Sensitive     TRIMETH/SULFA <=20 SENSITIVE Sensitive     AMPICILLIN/SULBACTAM <=2 SENSITIVE Sensitive     PIP/TAZO <=4 SENSITIVE Sensitive     * PROTEUS MIRABILIS  Blood Culture ID Panel (Reflexed)     Status: Abnormal   Collection Time: 06/09/19  5:00 PM  Result Value Ref Range Status   Enterococcus species NOT DETECTED NOT DETECTED Final   Listeria monocytogenes NOT DETECTED NOT DETECTED Final   Staphylococcus species NOT DETECTED NOT DETECTED Final   Staphylococcus aureus (BCID) NOT DETECTED NOT DETECTED Final   Streptococcus species NOT DETECTED NOT DETECTED Final   Streptococcus agalactiae NOT DETECTED NOT DETECTED Final   Streptococcus pneumoniae NOT DETECTED NOT DETECTED Final   Streptococcus pyogenes NOT DETECTED NOT DETECTED Final   Acinetobacter baumannii NOT DETECTED NOT DETECTED Final   Enterobacteriaceae species DETECTED (A) NOT DETECTED Final    Comment: Enterobacteriaceae represent a large family of gram-negative bacteria, not a single organism. CRITICAL RESULT CALLED TO, READ BACK BY AND VERIFIED WITH: Hughie Closs [facility] 06/11/19 1829 JDW    Enterobacter cloacae complex NOT DETECTED NOT DETECTED Final   Escherichia coli NOT DETECTED NOT DETECTED Final   Klebsiella oxytoca NOT DETECTED NOT DETECTED Final   Klebsiella pneumoniae NOT DETECTED NOT DETECTED Final   Proteus species DETECTED (A) NOT DETECTED Final    Comment: CRITICAL RESULT CALLED TO, READ BACK BY AND  VERIFIED WITH: Hughie Closs [facility] 06/11/19 1829 JDW    Serratia marcescens NOT DETECTED NOT DETECTED Final   Carbapenem resistance NOT DETECTED NOT DETECTED Final   Haemophilus influenzae NOT DETECTED  NOT DETECTED Final   Neisseria meningitidis NOT DETECTED NOT DETECTED Final   Pseudomonas aeruginosa NOT DETECTED NOT DETECTED Final   Candida albicans NOT DETECTED NOT DETECTED Final   Candida glabrata NOT DETECTED NOT DETECTED Final   Candida krusei NOT DETECTED NOT DETECTED Final   Candida parapsilosis NOT DETECTED NOT DETECTED Final   Candida tropicalis NOT DETECTED NOT DETECTED Final    Comment: Performed at [facility], [place] [address] [place], [place] [address]  Blood Culture (routine x 2)     Status: None   Collection Time: 06/09/19  5:05 PM   Specimen: BLOOD RIGHT HAND  Result Value Ref Range Status   Specimen Description BLOOD RIGHT HAND  Final   Special Requests   Final    BOTTLES DRAWN AEROBIC AND ANAEROBIC Blood Culture results may not be optimal due to an inadequate volume of blood received in culture bottles   Culture   Final    NO GROWTH 5 DAYS Performed at [facility], [place] [address], [place], [place] [address]    Report Status 06/14/2019 FINAL  Final  Urine culture     Status: Abnormal   Collection Time: 06/09/19  5:25 PM   Specimen: Urine, Catheterized  Result Value Ref Range Status   Specimen Description URINE, CATHETERIZED  Final   Special Requests   Final    NONE Performed at [facility], [place] [address] [place], [place] [address]    Culture >=100,000 COLONIES/mL PSEUDOMONAS AERUGINOSA (A)  Final   Report Status 06/11/2019 FINAL  Final   Organism ID, Bacteria PSEUDOMONAS AERUGINOSA (A)  Final      Susceptibility   Pseudomonas aeruginosa - MIC*    CEFTAZIDIME 2 SENSITIVE Sensitive     CIPROFLOXACIN 2 INTERMEDIATE Intermediate     GENTAMICIN >=16 RESISTANT Resistant     IMIPENEM 2 SENSITIVE Sensitive     PIP/TAZO 8 SENSITIVE Sensitive      CEFEPIME 2 SENSITIVE Sensitive     * >=100,000 COLONIES/mL PSEUDOMONAS AERUGINOSA  Aerobic Culture (superficial specimen)     Status: None   Collection Time: 06/09/19  8:16 PM   Specimen: Ulcer  Result Value Ref Range Status   Specimen Description ULCER RIGHT BUTTOCKS  Final   Special Requests NONE  Final   Gram Stain   Final    ABUNDANT WBC PRESENT, PREDOMINANTLY PMN ABUNDANT GRAM NEGATIVE RODS RARE GRAM POSITIVE COCCI Performed at [facility], [place] [address], [place], [place] [address]    Culture   Final    ABUNDANT KLEBSIELLA PNEUMONIAE ABUNDANT PROTEUS MIRABILIS Confirmed Extended Spectrum Beta-Lactamase Producer (ESBL).  In bloodstream infections from ESBL organisms, carbapenems are preferred over piperacillin/tazobactam. They are shown to have a lower risk of mortality.    Report Status 06/13/2019 FINAL  Final   Organism ID, Bacteria PROTEUS MIRABILIS  Final   Organism ID, Bacteria KLEBSIELLA PNEUMONIAE  Final      Susceptibility   Klebsiella pneumoniae - MIC*    AMPICILLIN >=32 RESISTANT Resistant     CEFAZOLIN >=64 RESISTANT Resistant     CEFEPIME >=32 RESISTANT Resistant     CEFTAZIDIME >=64 RESISTANT Resistant     CEFTRIAXONE >=64 RESISTANT Resistant     CIPROFLOXACIN >=4 RESISTANT Resistant     GENTAMICIN >=16 RESISTANT Resistant     IMIPENEM <=0.25 SENSITIVE Sensitive     TRIMETH/SULFA >=320 RESISTANT Resistant     AMPICILLIN/SULBACTAM >=32 RESISTANT Resistant     PIP/TAZO 32 INTERMEDIATE  Intermediate     * ABUNDANT KLEBSIELLA PNEUMONIAE   Proteus mirabilis - MIC*    AMPICILLIN <=2 SENSITIVE Sensitive     CEFAZOLIN <=4 SENSITIVE Sensitive     CEFEPIME <=0.12 SENSITIVE Sensitive     CEFTAZIDIME <=1 SENSITIVE Sensitive     CEFTRIAXONE <=0.25 SENSITIVE Sensitive     CIPROFLOXACIN <=0.25 SENSITIVE Sensitive     GENTAMICIN <=1 SENSITIVE Sensitive     IMIPENEM 2 SENSITIVE Sensitive     TRIMETH/SULFA <=20 SENSITIVE Sensitive     AMPICILLIN/SULBACTAM <=2  SENSITIVE Sensitive     PIP/TAZO <=4 SENSITIVE Sensitive     * ABUNDANT PROTEUS MIRABILIS  Nasopharyngeal Culture     Status: None   Collection Time: 06/09/19  9:01 PM   Specimen: Nasal Mucosa  Result Value Ref Range Status   Specimen Description NASOPHARYNGEAL  Final   Special Requests NONE  Final   Culture   Final    NO MRSA DETECTED Performed at [place] [address] [place], [place] [address]    Report Status 06/12/2019 FINAL  Final  SARS CORONAVIRUS 2 (TAT 6-24 HRS) Nasopharyngeal Nasopharyngeal Swab     Status: None   Collection Time: 06/09/19  9:14 PM   Specimen: Nasopharyngeal Swab  Result Value Ref Range Status   SARS Coronavirus 2 NEGATIVE NEGATIVE Final    Comment: (NOTE) SARS-CoV-2 target nucleic acids are NOT DETECTED. The SARS-CoV-2 RNA is generally detectable in upper and lower respiratory specimens during the acute phase of infection. Negative results do not preclude SARS-CoV-2 infection, do not rule out co-infections with other pathogens, and should not be used as the sole basis for treatment or other patient management decisions. Negative results must be combined with clinical observations, patient history, and epidemiological information. The expected result is Negative. Fact Sheet for Patients: SugarRoll.be Fact Sheet for Healthcare Providers: https://www.woods-mathews.com/ This test is not yet approved or cleared by the [place] FDA and  has been authorized for detection and/or diagnosis of SARS-CoV-2 by FDA under an Emergency Use Authorization (EUA). This EUA will remain  in effect (meaning this test can be used) for the duration of the COVID-19 declaration under Section 56 4(b)(1) of the Act, 21 U.S.C. section 360bbb-3(b)(1), unless the authorization is terminated or revoked sooner. Performed at [facility], [place] [address], [place], [place] [address]   MRSA PCR Screening     Status:  Abnormal   Collection Time: 06/10/19  5:14 AM   Specimen: Nasal Mucosa; Nasopharyngeal  Result Value Ref Range Status   MRSA by PCR POSITIVE (A) NEGATIVE Final    Comment:        The GeneXpert MRSA Assay (FDA approved for NASAL specimens only), is one component of a comprehensive MRSA colonization surveillance program. It is not intended to diagnose MRSA infection nor to guide or monitor treatment for MRSA infections. RESULT CALLED TO, READ BACK BY AND VERIFIED WITH: RN EU, T. [phone] FCP Performed at [facility], [place] [address], [place], [place] [address]      Studies: No results found.   Flora Lipps, MD  [facility] 06/15/2019

## 2019-06-15 NOTE — Progress Notes (Signed)
Wound care completed for patient and repositioned.

## 2019-06-15 NOTE — Progress Notes (Signed)
CSW made referral to [facility] to discuss with family residential hospice options and potentially [place] address. Trixie Dredge has initiated referral process.   [place], [place]

## 2019-06-16 NOTE — TOC Transition Note (Signed)
Transition of Care [facility]) - CM/SW Discharge Note   Patient Details  Name: Roy Crawford MRN: [id number] Date of Birth: 1956/01/27  [facility] CM/SW Contact:  Alberteen Sam, LCSW Phone Number: 06/16/2019, 9:38 AM   Clinical Narrative:     Patient will DC [id number] Place Anticipated DC date: 06/16/2019 Family notified:Rommie (nephew) Transport [id number]  Per MD patient ready for DC to [facility] . RN, patient, patient's family, and facility notified of DC. Discharge Summary sent to facility. RN given number for report  [phone]  . DC packet on chart. Ambulance transport requested for patient.  CSW signing off.  [place], [place]   Final next level of care: [place] Barriers to Discharge: No Barriers Identified   Patient Goals and CMS Choice   CMS Medicare.gov Compare Post Acute Care list provided to:: Patient Represenative (must comment)(Rommie (nephew)) Choice offered to / list presented to : [facility] POA / [place]  Discharge Placement              Patient chooses bed at: Other - please specify in the comment section below:([facility]) Patient to be transferred to facility by: [facility] Name of family member notified: Rommie Patient and family notified of of transfer: 06/16/19  Discharge Plan and Services                                     Social Determinants of Health (SDOH) Interventions     Readmission Risk Interventions No flowsheet data found.

## 2019-06-16 NOTE — Discharge Summary (Signed)
Physician Discharge Summary  Roy Crawford [id number] DOB: [birth year] DOA: 06/09/2019  PCP: [name], MD  Admit date: 06/09/2019 Discharge date: 06/16/2019  Admitted From: SNF  Discharge disposition: hospice facility   Recommendations for Outpatient Follow-Up:   . 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 Reportedly sent over for possible worsening ulcer. Patient is nonverbal at baseline and unable to provide history.In the ED: Patient was febrile up to 100.4, tachypneic at baseline 8L, 35% FiO2 through tracheostomy collar. WBC of 11.7, hemoglobin of 7.4 down from 8.5, platelet of 411. Lactate of 4.6. Na of 155, glucose of 127, AST of 114, ALT of 52, alkaline phosphatase of 1196. UA with positive nitrate and large leukocytes and many bacteria with budding yeast. He was given Tylenol suppository, 3L of LR fluidresuscitation, and started on vancomycin and cefepime.   Hospital Course:   Following conditions were addressed during hospitalization as listed below,  Sepsis secondary to UTIwith chronic indwelling foley catheter/cellulitis at PEG tube site/fungal UTI. Urinalysis  showed some budding  yeast.  was on IV Vancomycin for unknown indication prior to hospitalization. He was previously only discharged on IV cefepime for 2 weeks in October for pseudomonas pneumonia.  Received cefepime in the hospital this admission.  Blood culture showing Proteus mirabilis.  Local wound culture from the right buttocks wound showing Proteus and MDR Klebsiella. Off antibiotics for comfort care.  Chronic respiratory failure status post tracheostomy.  Currently on 5 L/min saturating 90%.  Proteus sepsis. 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  Hypernatremia  Na of 155 on presentation.  He was initially on D5 water and  tube water flushes.  Chronic anemia   Transfused 1 unit of packed RBC 06/10/2019.  No external bleeding reported.    Mild thrombocytosis  Reactive.  Transaminitis/elevated alkaline phosphatase  AST of 114, ALT of 52, alkaline phosphatase of 1196 on presentation.  Likely from from sepsis.    Cerebral Palsy with Intellectual disability Patient is nonverbal and contracted at baseline. Poor functional and cognitive ability with extensive contractures and bedbound status  Epilepsy Was on Keppra, Valproic acid. No seizures, on ativan for comfort.  Hypokalemia and hypomagnesemia.  Replenished IV .   HTN Was on Cardizem, Lopressor PO.   DM type II Received sliding scale.  Tube feeding  On comfort care only. Holding at this time.  Disposition.  At this time, patient is stable for disposition to hospice facility for comfort care.  Medical Consultants:    Palliative care  Procedures:    None Subjective:   Today, patient appears comfortable.  Awaiting for transfer to hospice facility  Discharge Exam:   Vitals:   06/16/19  0036 06/16/19 0318  BP: (!) 103/59   Pulse: 89 88  Resp: 16 16  Temp: (!) 97.5 F (36.4  C)   SpO2: 100% 95%   Vitals:   06/15/19 2139 06/15/19 2328 06/16/19 0036 06/16/19 0318  BP:   (!) 103/59   Pulse: 85 87 89 88  Resp: 16 16 16 16   Temp:   (!) 97.5 F (36.4 C)   TempSrc:   Oral   SpO2: 94% 99% 100% 95%  Weight:      Height:        General: Nonverbal chronically ill, deconditioned and frail.  Contracted extremities. HENT: pupils equally reacting to light, Chest:    Diminished breath sounds bilaterally. No crackles or wheezes.  CVS: S1 &S2 heard. No murmur.  Regular rate and rhythm. Abdomen: Soft, nontender, nondistended. PEG tube in place Extremities: No edema, contracted Psych: Lethargic, nonverbal.  Not follow commands CNS: Lethargic, nonverbal Skin: Right buttocks ulceration stage IV present on admission  The results of significant diagnostics from this hospitalization (including imaging, microbiology, ancillary and laboratory) are listed below for reference.     Diagnostic Studies:   DG Chest Port 1 View  Result Date: 06/09/2019 CLINICAL DATA:  Tachypnea.  Sacral pressure ulcer. EXAM: PORTABLE CHEST 1 VIEW COMPARISON:  03/05/2019 FINDINGS: Cardiac silhouette is mildly enlarged. No mediastinal or hilar masses. There are prominent bronchovascular markings. Interstitial and hazy airspace opacities are noted in the left lung centrally. No convincing pleural effusion.  No pneumothorax. Right PICC tip projects in the mid superior vena cava. Tracheostomy tube tip projects in the upper thoracic trachea. Skeletal structures show a marked thoracic dextroscoliosis. IMPRESSION: 1. Prominent bronchovascular markings with left lung interstitial and hazy intervening airspace opacities. Findings on the left may be due to bronchovascular crowding and atelectasis. Consider infection there are consistent clinical findings. No over pulmonary edema. 2. Stable mild cardiomegaly. 3. Dextroscoliosis. Electronically Signed   By: Lajean Manes M.D.   On: 06/09/2019 14:25   DG Hip Unilat  W or Wo Pelvis 2-3 Views Right  Result Date: 06/09/2019 CLINICAL DATA:  Stage IV pressure ulcer. Concern for osteomyelitis right ischium. EXAM: DG HIP (WITH OR WITHOUT PELVIS) 2-3V RIGHT COMPARISON:  None. FINDINGS: There is moderate diffuse osteopenia. There is no discernible left femoral head or neck as there is superior subluxation of the adjacent trochanteric portion of the left femur likely chronic deformity. Severe degenerative changes of the right hip with irregular appearance of the femoral head. Right hip joint is not well-defined as there may be partial chronic ankylosis. There is a 6.6 cm lucent defect overlying the right proximal femur likely representing air in the superimposed soft tissues compatible patient's known soft tissue ulcer. There is no focal lytic process over the right ischium. Chronic infection of the right hip joint is possible. There are degenerative changes of the spine. IMPRESSION: 1. 6.6 cm air collection within the soft tissues overlying the right hip likely involving the posterior tissues compatible patient's known soft tissue ulceration. No definite bone destruction of the adjacent right ischium. Associated chronic deformity of the right hip joint and femoral head as chronic infection is possible. 2.  Left hip deformity as described suggesting chronic deformity. Electronically Signed   By: Marin Olp M.D.   On: 06/09/2019 16:05     Labs:   Basic Metabolic Panel: Recent Labs  Lab 06/10/19 0816 06/10/19 [id number] 06/11/19 0620 06/11/19 0620 06/12/19 0510 06/12/19 0510 06/13/19 0407 06/14/19 0840  NA 153*  --  145  --  144  --  145 149*  K 3.2*   < > 3.0*   < > 3.3*   < > 3.9 4.1  CL 108  --  105  --  108  --  108 112*  CO2 38*  --  32  --  29  --  29 29  GLUCOSE 133*  --  183*  --  154*  --  226* 264*  BUN 38*  --  30*  --  27*  --  23 24*  CREATININE 0.74  --  0.93  --  0.66  --  0.73 0.80  CALCIUM 8.9  --  8.1*  --  7.5*  --  8.3* 8.7*  MG  --   --  1.7  --   1.5*  --  2.1 2.3  PHOS  --   --  3.6  --   --   --   --   --    < > = values in this interval not displayed.   GFR Estimated Creatinine Clearance: 76.3 mL/min (by C-G formula based on SCr of 0.8 mg/dL). Liver Function Tests: Recent Labs  Lab 06/09/19 1710 06/10/19 0250 06/11/19 0620 06/14/19 0840  AST 114* 80* 76* 147*  ALT 52* 41 30 52*  ALKPHOS 1,195* 995* 789* 1,588*  BILITOT 0.5 0.8 0.9 0.7  PROT 6.6 6.0* 5.3* 5.4*  ALBUMIN 1.2* 1.1* <1.0* <1.0*   No results for input(s): LIPASE, AMYLASE in the last 168 hours. No results for input(s): AMMONIA in the last 168 hours. Coagulation profile Recent Labs  Lab 06/09/19 1810  INR 1.2    CBC: Recent Labs  Lab 06/09/19 1710 06/09/19 1710 06/10/19 0250 06/10/19 1859 06/11/19 0402 06/12/19 0510 06/14/19 0840  WBC 11.7*  --  11.2*  --  8.3 9.9 8.5  NEUTROABS 8.8*  --   --   --   --   --  6.3  HGB 7.4*   < > 6.9* 6.9* 8.0* 8.1* 7.4*  HCT 24.8*   < > 22.3* 21.7* 25.5* 26.4* 24.4*  MCV 85.2  --  83.5  --  86.1 88.3 89.1  PLT 411*  --  398  --  299 277 295   < > = values in this interval not displayed.   Cardiac Enzymes: No results for input(s): CKTOTAL, CKMB, CKMBINDEX, TROPONINI in the last 168 hours. BNP: Invalid input(s): POCBNP CBG: Recent Labs  Lab 06/13/19 1626 06/13/19 2112 06/14/19 0622 06/14/19 0812 06/14/19 2130  GLUCAP 192* 176* 222* 223* 88   D-Dimer No results for input(s): DDIMER in the last 72 hours. Hgb A1c No results for input(s): HGBA1C in the last 72 hours. Lipid Profile No results for input(s): CHOL, HDL, LDLCALC, TRIG, CHOLHDL, LDLDIRECT in the last 72 hours. Thyroid function studies No results for input(s): TSH, T4TOTAL, T3FREE, THYROIDAB in the last 72 hours.  Invalid input(s): FREET3 Anemia work up No results for input(s): VITAMINB12, FOLATE, FERRITIN, TIBC, IRON, RETICCTPCT in the last 72 hours. Microbiology Recent Results (from the past 240 hour(s))  Blood Culture (routine x 2)      Status: Abnormal   Collection Time: 06/09/19  5:00 PM   Specimen: BLOOD  Result Value Ref Range Status   Specimen Description BLOOD RIGHT WRIST  Final   Special Requests   Final    BOTTLES DRAWN AEROBIC AND ANAEROBIC Blood Culture results may not be optimal due to an inadequate volume of blood received in culture bottles  Culture  Setup Time   Final    GRAM NEGATIVE RODS ANAEROBIC BOTTLE ONLY CRITICAL RESULT CALLED TO, READ BACK BY AND VERIFIED WITH: Hughie Closs [facility] 06/11/19 1829 JDW Performed at [facility], [place] [address] [place], [place] [address]    Culture PROTEUS MIRABILIS (A)  Final   Report Status 06/13/2019 FINAL  Final   Organism ID, Bacteria PROTEUS MIRABILIS  Final      Susceptibility   Proteus mirabilis - MIC*    AMPICILLIN <=2 SENSITIVE Sensitive     CEFAZOLIN <=4 SENSITIVE Sensitive     CEFEPIME <=0.12 SENSITIVE Sensitive     CEFTAZIDIME <=1 SENSITIVE Sensitive     CEFTRIAXONE <=0.25 SENSITIVE Sensitive     CIPROFLOXACIN <=0.25 SENSITIVE Sensitive     GENTAMICIN <=1 SENSITIVE Sensitive     IMIPENEM 1 SENSITIVE Sensitive     TRIMETH/SULFA <=20 SENSITIVE Sensitive     AMPICILLIN/SULBACTAM <=2 SENSITIVE Sensitive     PIP/TAZO <=4 SENSITIVE Sensitive     * PROTEUS MIRABILIS  Blood Culture ID Panel (Reflexed)     Status: Abnormal   Collection Time: 06/09/19  5:00 PM  Result Value Ref Range Status   Enterococcus species NOT DETECTED NOT DETECTED Final   Listeria monocytogenes NOT DETECTED NOT DETECTED Final   Staphylococcus species NOT DETECTED NOT DETECTED Final   Staphylococcus aureus (BCID) NOT DETECTED NOT DETECTED Final   Streptococcus species NOT DETECTED NOT DETECTED Final   Streptococcus agalactiae NOT DETECTED NOT DETECTED Final   Streptococcus pneumoniae NOT DETECTED NOT DETECTED Final   Streptococcus pyogenes NOT DETECTED NOT DETECTED Final   Acinetobacter baumannii NOT DETECTED NOT DETECTED Final   Enterobacteriaceae species DETECTED (A) NOT  DETECTED Final    Comment: Enterobacteriaceae represent a large family of gram-negative bacteria, not a single organism. CRITICAL RESULT CALLED TO, READ BACK BY AND VERIFIED WITH: Hughie Closs [facility] 06/11/19 1829 JDW    Enterobacter cloacae complex NOT DETECTED NOT DETECTED Final   Escherichia coli NOT DETECTED NOT DETECTED Final   Klebsiella oxytoca NOT DETECTED NOT DETECTED Final   Klebsiella pneumoniae NOT DETECTED NOT DETECTED Final   Proteus species DETECTED (A) NOT DETECTED Final    Comment: CRITICAL RESULT CALLED TO, READ BACK BY AND VERIFIED WITH: Hughie Closs [facility] 06/11/19 1829 JDW    Serratia marcescens NOT DETECTED NOT DETECTED Final   Carbapenem resistance NOT DETECTED NOT DETECTED Final   Haemophilus influenzae NOT DETECTED NOT DETECTED Final   Neisseria meningitidis NOT DETECTED NOT DETECTED Final   Pseudomonas aeruginosa NOT DETECTED NOT DETECTED Final   Candida albicans NOT DETECTED NOT DETECTED Final   Candida glabrata NOT DETECTED NOT DETECTED Final   Candida krusei NOT DETECTED NOT DETECTED Final   Candida parapsilosis NOT DETECTED NOT DETECTED Final   Candida tropicalis NOT DETECTED NOT DETECTED Final    Comment: Performed at [facility], [place] [address] [place], [place] [address]  Blood Culture (routine x 2)     Status: None   Collection Time: 06/09/19  5:05 PM   Specimen: BLOOD RIGHT HAND  Result Value Ref Range Status   Specimen Description BLOOD RIGHT HAND  Final   Special Requests   Final    BOTTLES DRAWN AEROBIC AND ANAEROBIC Blood Culture results may not be optimal due to an inadequate volume of blood received in culture bottles   Culture   Final    NO GROWTH 5 DAYS Performed at [facility], [place],  [place] [address]    Report Status 06/14/2019 FINAL  Final  Urine culture     Status: Abnormal   Collection Time: 06/09/19  5:25 PM   Specimen: Urine, Catheterized  Result Value Ref Range Status   Specimen Description URINE,  CATHETERIZED  Final   Special Requests   Final    NONE Performed at [facility], [place] [address], [place], [place] [address]    Culture >=100,000 COLONIES/mL PSEUDOMONAS AERUGINOSA (A)  Final   Report Status 06/11/2019 FINAL  Final   Organism ID, Bacteria PSEUDOMONAS AERUGINOSA (A)  Final      Susceptibility   Pseudomonas aeruginosa - MIC*    CEFTAZIDIME 2 SENSITIVE Sensitive     CIPROFLOXACIN 2 INTERMEDIATE Intermediate     GENTAMICIN >=16 RESISTANT Resistant     IMIPENEM 2 SENSITIVE Sensitive     PIP/TAZO 8 SENSITIVE Sensitive     CEFEPIME 2 SENSITIVE Sensitive     * >=100,000 COLONIES/mL PSEUDOMONAS AERUGINOSA  Aerobic Culture (superficial specimen)     Status: None   Collection Time: 06/09/19  8:16 PM   Specimen: Ulcer  Result Value Ref Range Status   Specimen Description ULCER RIGHT BUTTOCKS  Final   Special Requests NONE  Final   Gram Stain   Final    ABUNDANT WBC PRESENT, PREDOMINANTLY PMN ABUNDANT GRAM NEGATIVE RODS RARE GRAM POSITIVE COCCI Performed at [facility], [place] [address], [place], [place] [address]    Culture   Final    ABUNDANT KLEBSIELLA PNEUMONIAE ABUNDANT PROTEUS MIRABILIS Confirmed Extended Spectrum Beta-Lactamase Producer (ESBL).  In bloodstream infections from ESBL organisms, carbapenems are preferred over piperacillin/tazobactam. They are shown to have a lower risk of mortality.    Report Status 06/13/2019 FINAL  Final   Organism ID, Bacteria PROTEUS MIRABILIS  Final   Organism ID, Bacteria KLEBSIELLA PNEUMONIAE  Final      Susceptibility   Klebsiella pneumoniae - MIC*    AMPICILLIN >=32 RESISTANT Resistant     CEFAZOLIN >=64 RESISTANT Resistant     CEFEPIME >=32 RESISTANT Resistant     CEFTAZIDIME >=64 RESISTANT Resistant     CEFTRIAXONE >=64 RESISTANT Resistant     CIPROFLOXACIN >=4 RESISTANT Resistant     GENTAMICIN >=16 RESISTANT Resistant     IMIPENEM <=0.25 SENSITIVE Sensitive     TRIMETH/SULFA >=320 RESISTANT Resistant      AMPICILLIN/SULBACTAM >=32 RESISTANT Resistant     PIP/TAZO 32 INTERMEDIATE Intermediate     * ABUNDANT KLEBSIELLA PNEUMONIAE   Proteus mirabilis - MIC*    AMPICILLIN <=2 SENSITIVE Sensitive     CEFAZOLIN <=4 SENSITIVE Sensitive     CEFEPIME <=0.12 SENSITIVE Sensitive     CEFTAZIDIME <=1 SENSITIVE Sensitive     CEFTRIAXONE <=0.25 SENSITIVE Sensitive     CIPROFLOXACIN <=0.25 SENSITIVE Sensitive     GENTAMICIN <=1 SENSITIVE Sensitive     IMIPENEM 2 SENSITIVE Sensitive     TRIMETH/SULFA <=20 SENSITIVE Sensitive     AMPICILLIN/SULBACTAM <=2 SENSITIVE Sensitive     PIP/TAZO <=4 SENSITIVE Sensitive     * ABUNDANT PROTEUS MIRABILIS  Nasopharyngeal Culture     Status: None   Collection Time: 06/09/19  9:01 PM   Specimen: Nasal Mucosa  Result Value Ref Range Status   Specimen Description NASOPHARYNGEAL  Final   Special Requests NONE  Final   Culture   Final    NO MRSA DETECTED Performed at [facility], [address] [place], [place] [address]  Report Status 06/12/2019 FINAL  Final  SARS CORONAVIRUS 2 (TAT 6-24 HRS) Nasopharyngeal Nasopharyngeal Swab     Status: None   Collection Time: 06/09/19  9:14 PM   Specimen: Nasopharyngeal Swab  Result Value Ref Range Status   SARS Coronavirus 2 NEGATIVE NEGATIVE Final    Comment: (NOTE) SARS-CoV-2 target nucleic acids are NOT DETECTED. The SARS-CoV-2 RNA is generally detectable in upper and lower respiratory specimens during the acute phase of infection. Negative results do not preclude SARS-CoV-2 infection, do not rule out co-infections with other pathogens, and should not be used as the sole basis for treatment or other patient management decisions. Negative results must be combined with clinical observations, patient history, and epidemiological information. The expected result is Negative. Fact Sheet for Patients: SugarRoll.be Fact Sheet for Healthcare  Providers: https://www.woods-mathews.com/ This test is not yet approved or cleared by the [place] FDA and  has been authorized for detection and/or diagnosis of SARS-CoV-2 by FDA under an Emergency Use Authorization (EUA). This EUA will remain  in effect (meaning this test can be used) for the duration of the COVID-19 declaration under Section 56 4(b)(1) of the Act, 21 U.S.C. section 360bbb-3(b)(1), unless the authorization is terminated or revoked sooner. Performed at [facility], [place] [address] [place], [place] [address]   MRSA PCR Screening     Status: Abnormal   Collection Time: 06/10/19  5:14 AM   Specimen: Nasal Mucosa; Nasopharyngeal  Result Value Ref Range Status   MRSA by PCR POSITIVE (A) NEGATIVE Final    Comment:        The GeneXpert MRSA Assay (FDA approved for NASAL specimens only), is one component of a comprehensive MRSA colonization surveillance program. It is not intended to diagnose MRSA infection nor to guide or monitor treatment for MRSA infections. RESULT CALLED TO, READ BACK BY AND VERIFIED WITH: RN EU, T. [phone] FCP Performed at [facility], [place] [address] [place], [place] [address]      Discharge Instructions:   Discharge Instructions    Discharge instructions   Complete by: As directed    Comfort care as per hospice     Allergies as of 06/16/2019      Reactions   Amoxicillin-pot Clavulanate    Per MAR   Cephalexin    Per MAR   Clavulanic Acid    Per MAR   Penicillins    Per MAR      Medication List    STOP taking these medications   chlorhexidine 0.12 % solution Commonly known as: PERIDEX   CRITIC-AID EX   diltiazem 30 MG tablet Commonly known as: CARDIZEM   esomeprazole 40 MG capsule Commonly known as: NEXIUM   furosemide 20 MG tablet Commonly known as: LASIX   HYDROcodone-acetaminophen 5-325 MG tablet Commonly known as: NORCO/VICODIN   ibuprofen 600 MG tablet Commonly known as:  ADVIL   insulin lispro 100 UNIT/ML injection Commonly known as: HUMALOG   levETIRAcetam 1000 MG tablet Commonly known as: KEPPRA   metoCLOPramide 5 MG tablet Commonly known as: REGLAN   metoprolol tartrate 25 MG tablet Commonly known as: LOPRESSOR   metoprolol tartrate 5 MG/5ML Soln injection Commonly known as: LOPRESSOR   nystatin powder Generic drug: nystatin   polyethylene glycol 17 g packet Commonly known as: MIRALAX / GLYCOLAX   Replete Liqd   valproic acid 250 MG/5ML solution Commonly known as: DEPAKENE     TAKE these medications   acetaminophen 325 MG tablet Commonly  known as: TYLENOL Place 650 mg into feeding tube every 6 (six) hours as needed for mild pain.   albuterol (2.5 MG/3ML) 0.083% nebulizer solution Commonly known as: PROVENTIL Take 2.5 mg by nebulization every 6 (six) hours as needed for wheezing or shortness of breath.   lactulose 10 g packet Commonly known as: CEPHULAC 10 g daily as needed (for constipation). Per tube   LORazepam 2 MG/ML injection Commonly known as: ATIVAN Inject 2 mg into the vein every 2 (two) hours as needed for anxiety.       Time coordinating discharge: 39 minutes  Signed:  Chaunice Obie  [facility] 06/16/2019, 9:33 AM

## 2019-06-16 NOTE — Progress Notes (Signed)
Report given to Jeani Sow at [facility].

## 2019-06-16 NOTE — Progress Notes (Addendum)
[facility] [employer]  Received request 06/15/2019 from [place] for family interest in [place]. Chart reviewed and eligibility confirmed. Spoke with nephew Foley Arwood by phone to confirm interest and explain services. [facility] room available Wednesday, 06/16/2019 pending paperwork completion. Rommie plans to complete paperwork electronically. Will update TOC manager when paperwork is confirmed.   Addendum 9:20 - Paperwork completed by nephew. Please go ahead and send asap. TOC As[place] aware.   Please send signed and completed DNR with patient.  Please send discharge summary to [phone] prior to patient leaving the unit.  RN please call report to [phone].  Thank you,  Erling Conte, LCSW [phone]  [facility] liaisons are listed daily on AMION under Hospice and [place]

## 2019-06-16 NOTE — Plan of Care (Signed)
  Problem: Education: Goal: Knowledge of General Education information will improve Description Including pain rating scale, medication(s)/side effects and non-pharmacologic comfort measures Outcome: Progressing   

## 2019-06-21 ENCOUNTER — Telehealth: Payer: Self-pay | Admitting: *Deleted

## 2019-06-21 NOTE — Telephone Encounter (Signed)
Called for pt location.

## 2019-07-12 DEATH — deceased

## 2020-11-09 IMAGING — DX DG CHEST 1V PORT
1 series · 1 of 1 positions shown · non-contrast
Comparison: January 15, 2019

CLINICAL DATA: Shortness of breath

EXAM:
PORTABLE CHEST 1 VIEW

[chest ap]
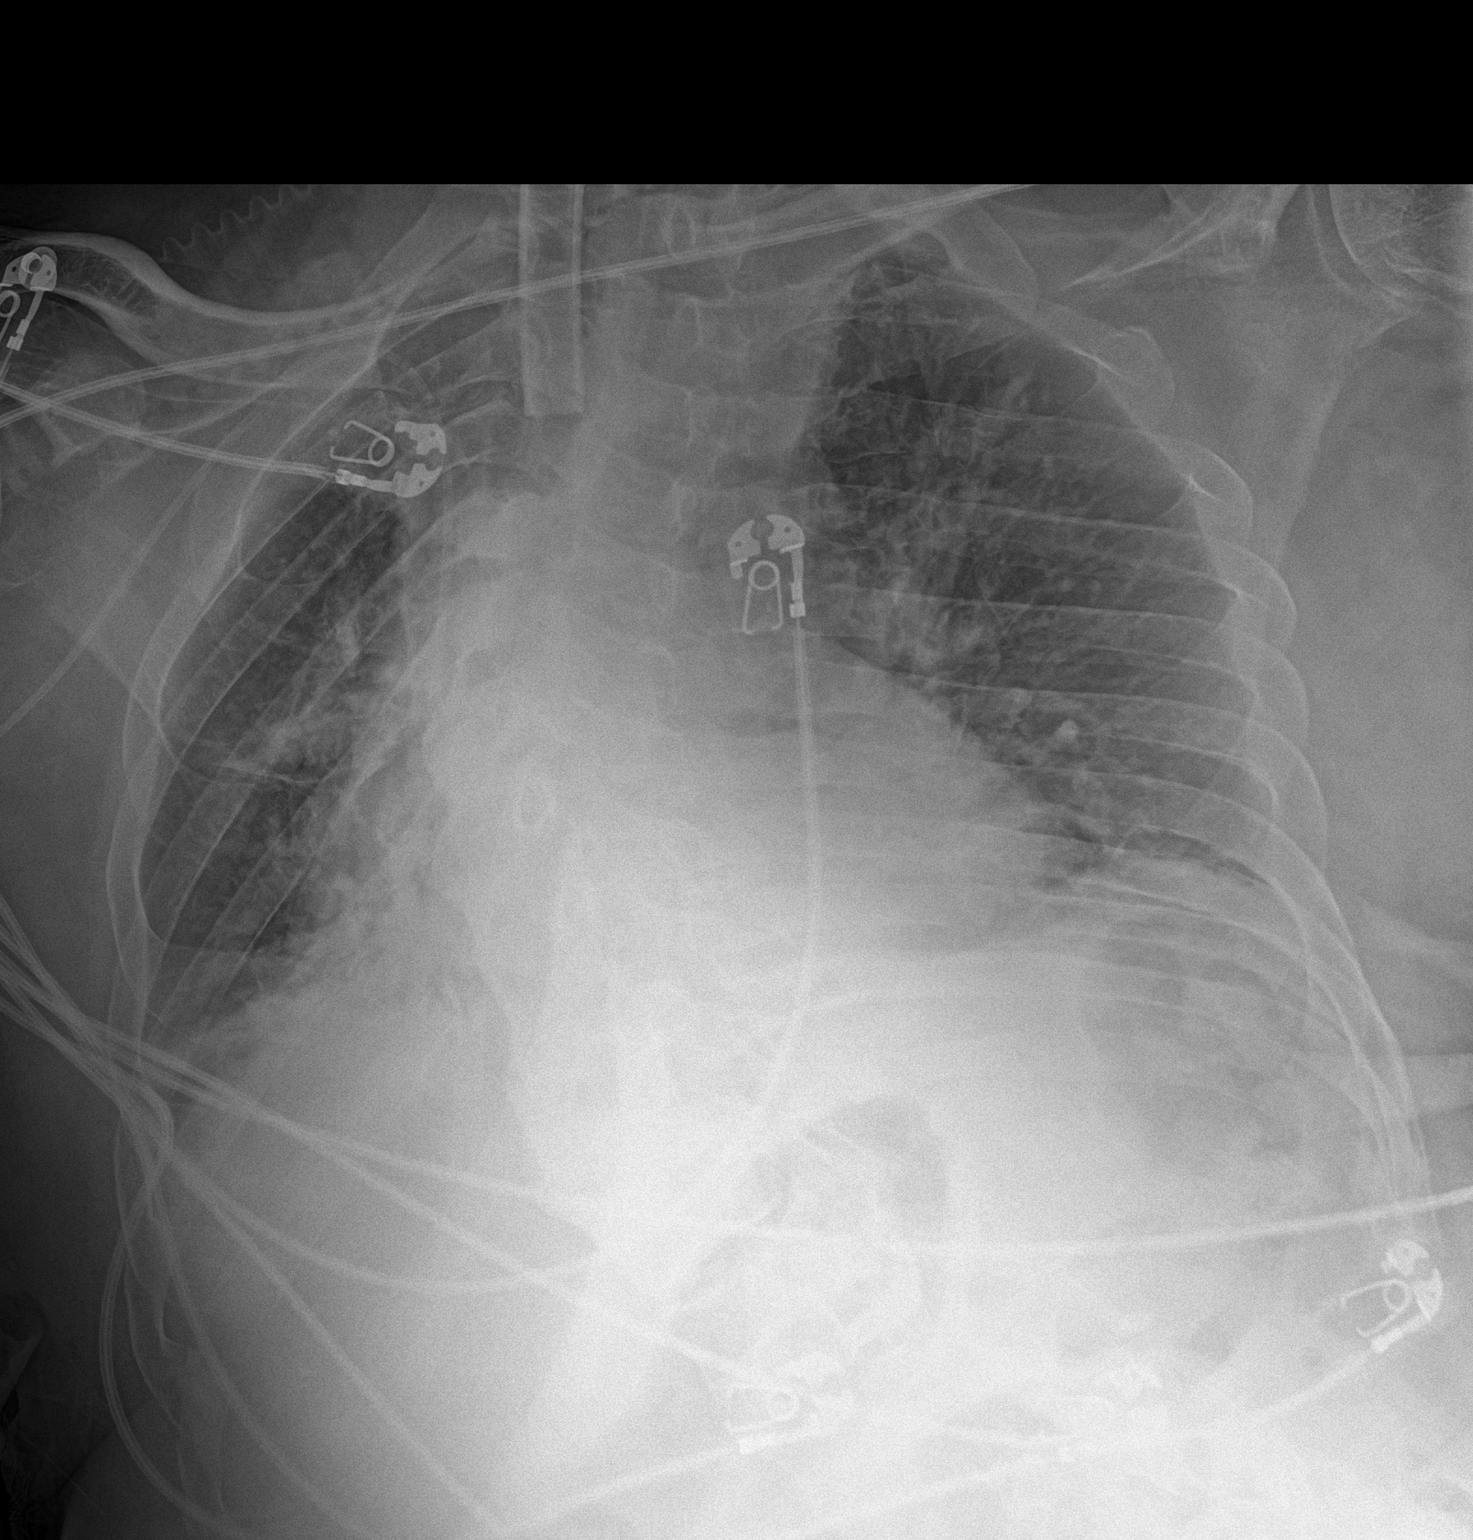

[1 of 1 positions shown; findings below may reference images not displayed]

FINDINGS: There is mild cardiomegaly. Tracheostomy tube seen at the level of
clavicular heads. There is mildly increased interstitial markings
throughout both lungs. Streaky opacity seen at the right lung base
and left upper lung.
IMPRESSION: Nonspecific increased interstitial markings and patchy airspace
opacity at the right lung base. This could be due to early
infectious etiology
